# Patient Record
Sex: Female | Born: 1965
Health system: Southern US, Community
[De-identification: ages and names within clinical notes are randomized; demographics above are authoritative.]

## PROBLEM LIST (undated history)

## (undated) DIAGNOSIS — E079 Disorder of thyroid, unspecified: Secondary | ICD-10-CM

## (undated) DIAGNOSIS — I10 Essential (primary) hypertension: Secondary | ICD-10-CM

## (undated) DIAGNOSIS — R454 Irritability and anger: Secondary | ICD-10-CM

## (undated) DIAGNOSIS — E785 Hyperlipidemia, unspecified: Secondary | ICD-10-CM

## (undated) HISTORY — PX: ROTATOR CUFF REPAIR: SHX139

---

## 2000-05-14 ENCOUNTER — Encounter: Payer: Self-pay | Admitting: Internal Medicine

## 2000-05-14 ENCOUNTER — Ambulatory Visit (HOSPITAL_COMMUNITY): Admission: RE | Admit: 2000-05-14 | Discharge: 2000-05-14 | Payer: Self-pay | Admitting: Internal Medicine

## 2002-07-25 ENCOUNTER — Ambulatory Visit (HOSPITAL_COMMUNITY): Admission: RE | Admit: 2002-07-25 | Discharge: 2002-07-25 | Payer: Self-pay | Admitting: Obstetrics & Gynecology

## 2002-11-04 ENCOUNTER — Encounter: Payer: Self-pay | Admitting: Internal Medicine

## 2002-11-04 ENCOUNTER — Ambulatory Visit (HOSPITAL_COMMUNITY): Admission: RE | Admit: 2002-11-04 | Discharge: 2002-11-04 | Payer: Self-pay | Admitting: Internal Medicine

## 2004-06-06 ENCOUNTER — Ambulatory Visit (HOSPITAL_COMMUNITY): Admission: RE | Admit: 2004-06-06 | Discharge: 2004-06-06 | Payer: Self-pay | Admitting: Internal Medicine

## 2004-06-09 ENCOUNTER — Ambulatory Visit (HOSPITAL_COMMUNITY): Admission: RE | Admit: 2004-06-09 | Discharge: 2004-06-09 | Payer: Self-pay | Admitting: Internal Medicine

## 2007-09-08 ENCOUNTER — Emergency Department (HOSPITAL_COMMUNITY): Admission: EM | Admit: 2007-09-08 | Discharge: 2007-09-08 | Payer: Self-pay | Admitting: Emergency Medicine

## 2010-06-17 NOTE — Op Note (Signed)
   NAME:  Deborah Baker, Deborah Baker                         ACCOUNT NO.:  [id number]   MEDICAL RECORD NO.:  [id number]                   PATIENT TYPE:  AMB   LOCATION:  DAY                                  FACILITY:  [facility]   PHYSICIAN:  Lazaro Arms, M.D.                DATE OF BIRTH:  1965-10-12   DATE OF PROCEDURE:  07/25/2002  DATE OF DISCHARGE:                                 OPERATIVE REPORT   PREOPERATIVE DIAGNOSIS:  Symptomatic and persistent right Bartholin's gland  cyst.   POSTOPERATIVE DIAGNOSIS:  Symptomatic and persistent right Bartholin's gland  cyst.   OPERATION/PROCEDURE:  Excision of right Bartholin's gland cyst.   SURGEON:  Lazaro Arms, M.D.   ANESTHESIA:  General endotracheal anesthesia.   FINDINGS:  The patient has had Bartholin's gland cyst for sometime.  At  times it has been abscessed and then drained.  It is currently draining.   DESCRIPTION OF PROCEDURE:  The patient was taken to the operating room and  placed in the supine position where she underwent general endotracheal  anesthesia.  She was then placed in the dorsal lithotomy position and her  lower abdomen, perineum vagina and perianal area were prepped and draped in  the usual sterile fashion.  The Bartholin's gland cyst was identified.  An  incision was made on the right vulva just lateral to the hymenal ring on the  right and dissected and identified the Bartholin's gland cyst.  It was  rather oblong and extended back and the Bartholin's gland canal was very  indurated.  It was probably about the 2.5 cm in size.  The surrounding  vulvar fat and tissue, however, was also indurated and were removed as well.  The cavity was made hemostatic with electrocautery and it was closed in  layers with 3-0 Monocryl.  It was probably closed in at least three layers.  The superficial skin was then closed with interrupted sutures as well.  There was good hemostasis.  0.5% Marcaine with 1:200,000 epinephrine was  injected as a local anesthetic.  After the procedure the patient was  awakened from anesthesia and taken to the recovery room in good stable  condition.  All counts were correct x 3.  She experienced 250 mL of blood  loss.                                               Lazaro Arms, M.D.    Loraine Maple  D:  07/25/2002  T:  07/25/2002  Job:  [id number]

## 2013-02-03 ENCOUNTER — Emergency Department (HOSPITAL_COMMUNITY)
Admission: EM | Admit: 2013-02-03 | Discharge: 2013-02-03 | Disposition: A | Discharge status: Home / Self Care | Payer: Self-pay | Attending: Emergency Medicine | Admitting: Emergency Medicine

## 2013-02-03 ENCOUNTER — Encounter (HOSPITAL_COMMUNITY): Payer: Self-pay | Admitting: Emergency Medicine

## 2013-02-03 DIAGNOSIS — Z79899 Other long term (current) drug therapy: Secondary | ICD-10-CM | POA: Insufficient documentation

## 2013-02-03 DIAGNOSIS — M7989 Other specified soft tissue disorders: Secondary | ICD-10-CM | POA: Insufficient documentation

## 2013-02-03 DIAGNOSIS — I10 Essential (primary) hypertension: Secondary | ICD-10-CM | POA: Insufficient documentation

## 2013-02-03 HISTORY — DX: Essential (primary) hypertension: I10

## 2013-02-03 LAB — COMPREHENSIVE METABOLIC PANEL
ALT: 16 U/L (ref 0–35)
AST: 16 U/L (ref 0–37)
Albumin: 3.9 g/dL (ref 3.5–5.2)
Alkaline Phosphatase: 87 U/L (ref 39–117)
BUN: 10 mg/dL (ref 6–23)
CO2: 28 mEq/L (ref 19–32)
CREATININE: 0.72 mg/dL (ref 0.50–1.10)
Calcium: 9.4 mg/dL (ref 8.4–10.5)
Chloride: 101 mEq/L (ref 96–112)
GFR calc Af Amer: >90 mL/min (ref >90)
GFR calc non Af Amer: >90 mL/min (ref >90)
Glucose, Bld: 101 mg/dL — ABNORMAL HIGH (ref 70–99)
Potassium: 3.8 mEq/L (ref 3.7–5.3)
SODIUM: 141 meq/L (ref 137–147)
TOTAL PROTEIN: 7.1 g/dL (ref 6.0–8.3)
Total Bilirubin: 0.2 mg/dL — ABNORMAL LOW (ref 0.3–1.2)

## 2013-02-03 LAB — CBC WITH DIFFERENTIAL/PLATELET
Basophils Absolute: 0.1 10*3/uL (ref 0.0–0.1)
Basophils Relative: 1 % (ref 0–1)
EOS ABS: 0.2 10*3/uL (ref 0.0–0.7)
EOS PCT: 4 % (ref 0–5)
HEMATOCRIT: 34.3 % — AB (ref 36.0–46.0)
Hemoglobin: 11.5 g/dL — ABNORMAL LOW (ref 12.0–15.0)
Lymphocytes Relative: 32 % (ref 12–46)
Lymphs Abs: 1.8 10*3/uL (ref 0.7–4.0)
MCH: 29.2 pg (ref 26.0–34.0)
MCHC: 33.5 g/dL (ref 30.0–36.0)
MCV: 87.1 fL (ref 78.0–100.0)
MONO ABS: 0.6 10*3/uL (ref 0.1–1.0)
Monocytes Relative: 10 % (ref 3–12)
Neutro Abs: 3 10*3/uL (ref 1.7–7.7)
Neutrophils Relative %: 53 % (ref 43–77)
Platelets: 300 10*3/uL (ref 150–400)
RBC: 3.94 MIL/uL (ref 3.87–5.11)
RDW: 12.6 % (ref 11.5–15.5)
WBC: 5.7 10*3/uL (ref 4.0–10.5)

## 2013-02-03 MED ORDER — HYDROCHLOROTHIAZIDE 25 MG PO TABS: 25.0000 mg | ORAL_TABLET | Freq: Every day | ORAL | Status: DC

## 2013-02-03 NOTE — ED Notes (Signed)
Pt alert & oriented x4, stable gait. Patient  given discharge instructions, paperwork & prescription(s). Patient verbalized understanding. Pt left department w/ no further questions. 

## 2013-02-03 NOTE — Discharge Instructions (Signed)
Started her blood pressure medicine hydrochlorothiazide. Make plans and followup the health Department sometime in the next week have blood pressure rechecked on the medicine. Your basic chemistries here today were normal. Return for any new or worse symptoms. Return for a stroke symptoms severe chest pain shortness of breath severe headache.

## 2013-02-03 NOTE — ED Provider Notes (Signed)
CSN: [id number]     Arrival date & time 02/03/13  1135 History   First MD Initiated Contact with Patient 02/03/13 1746 This chart was scribed for Deborah JakesScott W. Donnelle Olmeda, MD by Valera CastleSteven Perry, ED Scribe. This patient was seen in room [facility] and the patient's care was started at 6:06 PM.      Chief Complaint  Patient presents with  . Hypertension    Patient is a 48 y.o. female presenting with hypertension. The history is provided by the patient. No language interpreter was used.  Hypertension This is a new problem. Episode onset: Earlier this morning. The problem occurs constantly. The problem has been gradually improving. Pertinent negatives include no chest pain, no abdominal pain, no headaches and no shortness of breath. She has tried nothing for the symptoms.   HPI Comments: Deborah Baker is a 48 y.o. female who presents to the Emergency Department complaining of HTN, onset earlier today when she was about to go under tooth extraction at dentist. She reports going to PCP about 2:30 PM today, who she states then sent here to the ED. Pt denies being on any medication for BP. She reports her BP of 230/140 is normal. She reports ever since coming off of HTCZ her BP has been high. She reports she was on 25 mg once a day, with the option of twice a day as needed. She reports mild LE swelling. She denies headache, chest pain, and any other associated symptoms.   PCP - Default, Provider, MD  Past Medical History  Diagnosis Date  . Hypertension    Past Surgical History  Procedure Laterality Date  . Rotator cuff repair     History reviewed. No pertinent family history. History  Substance Use Topics  . Smoking status: Never Smoker   . Smokeless tobacco: Not on file  . Alcohol Use: Yes   OB History   Grav Para Term Preterm Abortions TAB SAB Ect Mult Living                 Review of Systems  Constitutional: Negative for fever and chills.  HENT: Negative for rhinorrhea and sore throat.    Eyes: Negative for visual disturbance.  Respiratory: Negative for cough and shortness of breath.   Cardiovascular: Positive for leg swelling (bilateral). Negative for chest pain.       + HTN.  Gastrointestinal: Negative for nausea, vomiting, abdominal pain and diarrhea.  Genitourinary: Negative for dysuria.  Musculoskeletal: Negative for back pain and neck pain.  Skin: Negative for rash.  Neurological: Negative for weakness, numbness and headaches.  Hematological: Does not bruise/bleed easily.  Psychiatric/Behavioral: Negative for confusion.    Allergies  Sulfa antibiotics  Home Medications   Current Outpatient Rx  Name  Route  Sig  Dispense  Refill  . ibuprofen (ADVIL,MOTRIN) 200 MG tablet   Oral   Take 200 mg by mouth every 6 (six) hours as needed.         . hydrochlorothiazide (HYDRODIURIL) 25 MG tablet   Oral   Take 1 tablet (25 mg total) by mouth daily.   14 tablet   4     BP 214/78  Pulse 69  Temp(Src) 97.7 F (36.5 C) (Oral)  Resp 20  Ht 5\' 3"  (1.6 m)  Wt 212 lb (96.163 kg)  BMI 37.56 kg/m2  SpO2 99%  LMP 12/29/2012  Physical Exam  Nursing note and vitals reviewed. Constitutional: She is oriented to person, place, and time. She appears well-developed and  well-nourished. No distress.  HENT:  Head: Normocephalic and atraumatic.  Mouth/Throat: Oropharynx is clear and moist and mucous membranes are normal.  Eyes: EOM are normal.  Neck: Neck supple. No tracheal deviation present.  Cardiovascular: Normal rate, regular rhythm and normal heart sounds.  Exam reveals no gallop and no friction rub.   No murmur heard. Pulmonary/Chest: Effort normal and breath sounds normal. No respiratory distress. She has no wheezes. She has no rales.  Abdominal: Soft. There is no tenderness.  Musculoskeletal: Normal range of motion. She exhibits no edema.  Neurological: She is alert and oriented to person, place, and time. No cranial nerve deficit. She exhibits normal muscle  tone. Coordination normal.  Skin: Skin is warm and dry.  Psychiatric: She has a normal mood and affect. Her behavior is normal.    ED Course  Procedures (including critical care time)  DIAGNOSTIC STUDIES: Oxygen Saturation is 99% on room air, normal by my interpretation.    COORDINATION OF CARE: 6:11 PM-Discussed treatment plan with pt at bedside and pt agreed to plan.   Results for orders placed during the hospital encounter of 02/03/13  CBC WITH DIFFERENTIAL      Result Value Range   WBC 5.7  4.0 - 10.5 K/uL   RBC 3.94  3.87 - 5.11 MIL/uL   Hemoglobin 11.5 (*) 12.0 - 15.0 g/dL   HCT 40.9 (*) 81.1 - 91.4 %   MCV 87.1  78.0 - 100.0 fL   MCH 29.2  26.0 - 34.0 pg   MCHC 33.5  30.0 - 36.0 g/dL   RDW 78.2  95.6 - 21.3 %   Platelets 300  150 - 400 K/uL   Neutrophils Relative % 53  43 - 77 %   Neutro Abs 3.0  1.7 - 7.7 K/uL   Lymphocytes Relative 32  12 - 46 %   Lymphs Abs 1.8  0.7 - 4.0 K/uL   Monocytes Relative 10  3 - 12 %   Monocytes Absolute 0.6  0.1 - 1.0 K/uL   Eosinophils Relative 4  0 - 5 %   Eosinophils Absolute 0.2  0.0 - 0.7 K/uL   Basophils Relative 1  0 - 1 %   Basophils Absolute 0.1  0.0 - 0.1 K/uL  COMPREHENSIVE METABOLIC PANEL      Result Value Range   Sodium 141  137 - 147 mEq/L   Potassium 3.8  3.7 - 5.3 mEq/L   Chloride 101  96 - 112 mEq/L   CO2 28  19 - 32 mEq/L   Glucose, Bld 101 (*) 70 - 99 mg/dL   BUN 10  6 - 23 mg/dL   Creatinine, Ser 0.86  0.50 - 1.10 mg/dL   Calcium 9.4  8.4 - 57.8 mg/dL   Total Protein 7.1  6.0 - 8.3 g/dL   Albumin 3.9  3.5 - 5.2 g/dL   AST 16  0 - 37 U/L   ALT 16  0 - 35 U/L   Alkaline Phosphatase 87  39 - 117 U/L   Total Bilirubin 0.2 (*) 0.3 - 1.2 mg/dL   GFR calc non Af Amer >90  >90 mL/min   GFR calc Af Amer >90  >90 mL/min   No results found.   EKG Interpretation   None      Medications - No data to display   MDM   1. Hypertension    Patient with history of hypertension the past has been on  hydrochlorothiazide as well for  for a long period of time. Patient's blood pressure was markedly elevated at the oral surgeons today. Patient referred to health department and referred her here patient not having any end organ symptoms suggestive of a hypertensive emergency no headache or strokelike symptoms no chest pain no shortness of breath. Patient's blood pressure here without any treatment has improved significantly most recently was 182/77 still elevated on the systolic number we'll start her back on her hydrochlorothiazide and have her followup with the health department for monitoring of blood pressure.    I personally performed the services described in this documentation, which was scribed in my presence. The recorded information has been reviewed and is accurate.     Deborah Jakes, MD 02/03/13 Rickey Primus

## 2013-02-03 NOTE — ED Notes (Signed)
Sent from health dept for hypertension.  bp 230/140 .  Had been to dentist for  Tooth extraction and then to health dept due to high bp.

## 2013-04-01 ENCOUNTER — Other Ambulatory Visit (HOSPITAL_COMMUNITY): Payer: Self-pay | Admitting: Family Medicine

## 2013-04-01 ENCOUNTER — Ambulatory Visit (HOSPITAL_COMMUNITY)
Admission: RE | Admit: 2013-04-01 | Discharge: 2013-04-01 | Disposition: A | Discharge status: Home / Self Care | Payer: Self-pay | Source: Ambulatory Visit | Attending: Family Medicine | Admitting: Family Medicine

## 2013-04-01 DIAGNOSIS — M25519 Pain in unspecified shoulder: Secondary | ICD-10-CM | POA: Insufficient documentation

## 2013-04-01 DIAGNOSIS — M25511 Pain in right shoulder: Secondary | ICD-10-CM

## 2014-02-08 ENCOUNTER — Emergency Department (HOSPITAL_COMMUNITY)
Admission: EM | Admit: 2014-02-08 | Discharge: 2014-02-08 | Disposition: A | Discharge status: Home / Self Care | Payer: 59 | Attending: Emergency Medicine | Admitting: Emergency Medicine

## 2014-02-08 ENCOUNTER — Emergency Department (HOSPITAL_COMMUNITY): Payer: 59

## 2014-02-08 ENCOUNTER — Encounter (HOSPITAL_COMMUNITY): Payer: Self-pay | Admitting: Adult Health

## 2014-02-08 DIAGNOSIS — R52 Pain, unspecified: Secondary | ICD-10-CM

## 2014-02-08 DIAGNOSIS — R109 Unspecified abdominal pain: Secondary | ICD-10-CM

## 2014-02-08 DIAGNOSIS — Z3202 Encounter for pregnancy test, result negative: Secondary | ICD-10-CM | POA: Diagnosis not present

## 2014-02-08 DIAGNOSIS — Z79899 Other long term (current) drug therapy: Secondary | ICD-10-CM | POA: Insufficient documentation

## 2014-02-08 DIAGNOSIS — B349 Viral infection, unspecified: Secondary | ICD-10-CM | POA: Diagnosis not present

## 2014-02-08 DIAGNOSIS — F419 Anxiety disorder, unspecified: Secondary | ICD-10-CM | POA: Diagnosis not present

## 2014-02-08 DIAGNOSIS — J209 Acute bronchitis, unspecified: Secondary | ICD-10-CM

## 2014-02-08 DIAGNOSIS — M549 Dorsalgia, unspecified: Secondary | ICD-10-CM | POA: Insufficient documentation

## 2014-02-08 DIAGNOSIS — K219 Gastro-esophageal reflux disease without esophagitis: Secondary | ICD-10-CM | POA: Insufficient documentation

## 2014-02-08 DIAGNOSIS — F329 Major depressive disorder, single episode, unspecified: Secondary | ICD-10-CM | POA: Insufficient documentation

## 2014-02-08 DIAGNOSIS — I10 Essential (primary) hypertension: Secondary | ICD-10-CM | POA: Diagnosis not present

## 2014-02-08 LAB — URINALYSIS, ROUTINE W REFLEX MICROSCOPIC
Bilirubin Urine: NEGATIVE
Glucose, UA: NEGATIVE mg/dL
Ketones, ur: NEGATIVE mg/dL
LEUKOCYTES UA: NEGATIVE
Nitrite: NEGATIVE
Protein, ur: NEGATIVE mg/dL
Specific Gravity, Urine: 1.023 (ref 1.005–1.030)
UROBILINOGEN UA: 0.2 mg/dL (ref 0.0–1.0)
pH: 6 (ref 5.0–8.0)

## 2014-02-08 LAB — COMPREHENSIVE METABOLIC PANEL
ALT: 25 U/L (ref 0–35)
AST: 26 U/L (ref 0–37)
Albumin: 4 g/dL (ref 3.5–5.2)
Alkaline Phosphatase: 99 U/L (ref 39–117)
Anion gap: 14 (ref 5–15)
BILIRUBIN TOTAL: 0.4 mg/dL (ref 0.3–1.2)
BUN: 14 mg/dL (ref 6–23)
CALCIUM: 9.6 mg/dL (ref 8.4–10.5)
CO2: 25 mmol/L (ref 19–32)
CREATININE: 0.86 mg/dL (ref 0.50–1.10)
Chloride: 99 mEq/L (ref 96–112)
GFR calc Af Amer: >90 mL/min (ref >90)
GFR calc non Af Amer: 79 mL/min — ABNORMAL LOW (ref >90)
GLUCOSE: 111 mg/dL — AB (ref 70–99)
POTASSIUM: 3.4 mmol/L — AB (ref 3.5–5.1)
SODIUM: 138 mmol/L (ref 135–145)
Total Protein: 7.4 g/dL (ref 6.0–8.3)

## 2014-02-08 LAB — CBC WITH DIFFERENTIAL/PLATELET
BASOS PCT: 0 % (ref 0–1)
Basophils Absolute: 0 10*3/uL (ref 0.0–0.1)
Eosinophils Absolute: 0 10*3/uL (ref 0.0–0.7)
Eosinophils Relative: 0 % (ref 0–5)
HCT: 35 % — ABNORMAL LOW (ref 36.0–46.0)
Hemoglobin: 11.5 g/dL — ABNORMAL LOW (ref 12.0–15.0)
LYMPHS PCT: 12 % (ref 12–46)
Lymphs Abs: 2 10*3/uL (ref 0.7–4.0)
MCH: 28 pg (ref 26.0–34.0)
MCHC: 32.9 g/dL (ref 30.0–36.0)
MCV: 85.2 fL (ref 78.0–100.0)
Monocytes Absolute: 0.8 10*3/uL (ref 0.1–1.0)
Monocytes Relative: 5 % (ref 3–12)
NEUTROS PCT: 83 % — AB (ref 43–77)
Neutro Abs: 14 10*3/uL — ABNORMAL HIGH (ref 1.7–7.7)
PLATELETS: 366 10*3/uL (ref 150–400)
RBC: 4.11 MIL/uL (ref 3.87–5.11)
RDW: 13.8 % (ref 11.5–15.5)
WBC: 16.8 10*3/uL — AB (ref 4.0–10.5)

## 2014-02-08 LAB — URINE MICROSCOPIC-ADD ON

## 2014-02-08 LAB — LIPASE, BLOOD: Lipase: 27 U/L (ref 11–59)

## 2014-02-08 LAB — POC URINE PREG, ED: Preg Test, Ur: NEGATIVE

## 2014-02-08 MED ORDER — ALBUTEROL SULFATE HFA 108 (90 BASE) MCG/ACT IN AERS: 1.0000 | INHALATION_SPRAY | Freq: Four times a day (QID) | RESPIRATORY_TRACT | Status: DC | PRN

## 2014-02-08 MED ORDER — ONDANSETRON HCL 4 MG PO TABS: 4.0000 mg | ORAL_TABLET | Freq: Four times a day (QID) | ORAL | Status: DC

## 2014-02-08 MED ORDER — HYDROCODONE-ACETAMINOPHEN 5-325 MG PO TABS: 1.0000 | ORAL_TABLET | Freq: Four times a day (QID) | ORAL | Status: DC | PRN

## 2014-02-08 MED ORDER — HYDROMORPHONE HCL 1 MG/ML IJ SOLN
1.0000 mg | Freq: Once | INTRAMUSCULAR | Status: AC
  Administered 2014-02-08: 1 mg via INTRAVENOUS
  Filled 2014-02-08: qty 1

## 2014-02-08 MED ORDER — ONDANSETRON 4 MG PO TBDP
8.0000 mg | ORAL_TABLET | Freq: Once | ORAL | Status: DC
  Filled 2014-02-08: qty 2

## 2014-02-08 MED ORDER — ONDANSETRON HCL 4 MG/2ML IJ SOLN
4.0000 mg | Freq: Once | INTRAMUSCULAR | Status: AC
  Administered 2014-02-08: 4 mg via INTRAVENOUS
  Filled 2014-02-08: qty 2

## 2014-02-08 MED ORDER — SODIUM CHLORIDE 0.9 % IV BOLUS (SEPSIS)
1000.0000 mL | Freq: Once | INTRAVENOUS | Status: AC
  Administered 2014-02-08: 1000 mL via INTRAVENOUS

## 2014-02-08 MED ORDER — BENZONATATE 100 MG PO CAPS: 100.0000 mg | ORAL_CAPSULE | Freq: Three times a day (TID) | ORAL | Status: DC

## 2014-02-08 NOTE — ED Notes (Signed)
PT states, "I aspirated this morning, i was laying flat and the food came back up into my mouth and I swallowed it then I started coughing and then I started violently vomiting and I have been nauseated and had painin my right upper side ever since, I broke a rib , I think" denies diarrhea, denies pain in abdomen. Reports pain in right side upper flank.

## 2014-02-08 NOTE — ED Provider Notes (Signed)
CSN: [id number]     Arrival date & time 02/08/14  1714 History   First MD Initiated Contact with Patient 02/08/14 1939     Chief Complaint  Patient presents with  . Abdominal Pain   HPI  Patient is a 49 year old female with past medical history of hypertension, depression, anxiety, and acid reflux who presents to the emergency room for evaluation of right flank pain, nausea, and vomiting which started this morning. Patient states that she was sleeping when she aspirated in her sleep which caused her to cough violently and immediately started vomiting. She is continue to have nausea and vomiting with 12 episodes total. She has been vomiting food product. She has not had any bilious emesis. Patient states that once her vomiting stopped at approximately 2 PM she developed right sided flank pain which is severe and nonradiating. Patient states that she does not have a gallbladder. Patient states that her pain is worse with movement. She feels that she may have broke a rib. Patient has never had pain like this before. She does state that she was violently vomiting. She denies any urinary frequency or urgency. She denies hematuria or dysuria. She has no vaginal discharge or bleeding. Patient has no other abdominal pain. She denies fevers, chills, diarrhea, melena, hematochezia.  Past Medical History  Diagnosis Date  . Hypertension    Past Surgical History  Procedure Laterality Date  . Rotator cuff repair     History reviewed. No pertinent family history. History  Substance Use Topics  . Smoking status: Never Smoker   . Smokeless tobacco: Not on file  . Alcohol Use: Yes   OB History    No data available     Review of Systems  Constitutional: Negative for fever, chills and fatigue.  Respiratory: Positive for cough and choking. Negative for chest tightness, shortness of breath and wheezing.   Cardiovascular: Negative for chest pain and palpitations.  Gastrointestinal: Positive for nausea and  vomiting. Negative for abdominal pain, diarrhea and constipation.  Genitourinary: Positive for flank pain. Negative for dysuria, urgency, frequency and difficulty urinating.  Musculoskeletal: Positive for back pain.  All other systems reviewed and are negative.     Allergies  Sulfa antibiotics  Home Medications   Prior to Admission medications   Medication Sig Start Date End Date Taking? Authorizing Provider  alum hydroxide-mag trisilicate (GAVISCON) 80-20 MG CHEW chewable tablet Chew 1 tablet by mouth 3 (three) times daily as needed for indigestion or heartburn.   Yes Historical Provider, MD  Azilsartan-Chlorthalidone (EDARBYCLOR) 40-25 MG TABS Take 1 tablet by mouth daily.   Yes Historical Provider, MD  escitalopram (LEXAPRO) 20 MG tablet Take 20 mg by mouth at bedtime.   Yes Historical Provider, MD  LORazepam (ATIVAN) 0.5 MG tablet Take 0.5 mg by mouth every 8 (eight) hours.   Yes Historical Provider, MD  lovastatin (MEVACOR) 20 MG tablet Take 20 mg by mouth 2 (two) times daily.   Yes Historical Provider, MD  montelukast (SINGULAIR) 10 MG tablet Take 10 mg by mouth at bedtime.   Yes Historical Provider, MD  omeprazole (PRILOSEC) 20 MG capsule Take 20 mg by mouth daily.   Yes Historical Provider, MD  albuterol (PROVENTIL HFA;VENTOLIN HFA) 108 (90 BASE) MCG/ACT inhaler Inhale 1-2 puffs into the lungs every 6 (six) hours as needed for wheezing or shortness of breath. 02/08/14   Ayoub Arey A Forcucci, PA-C  benzonatate (TESSALON) 100 MG capsule Take 1 capsule (100 mg total) by mouth every 8 (eight)  hours. 02/08/14   Qiana Landgrebe A Forcucci, PA-C  hydrochlorothiazide (HYDRODIURIL) 25 MG tablet Take 1 tablet (25 mg total) by mouth daily. 02/03/13   Vanetta Mulders, MD  HYDROcodone-acetaminophen (NORCO/VICODIN) 5-325 MG per tablet Take 1 tablet by mouth every 6 (six) hours as needed. 02/08/14   Cleotha Tsang A Forcucci, PA-C  ondansetron (ZOFRAN) 4 MG tablet Take 1 tablet (4 mg total) by mouth every 6 (six)  hours. 02/08/14   Antwaine Boomhower A Forcucci, PA-C   BP 115/54 mmHg  Pulse 76  Temp(Src) 98.9 F (37.2 C) (Oral)  Resp 14  Ht  (1.575 m)  Wt 210 lb (95.255 kg)  BMI 38.40 kg/m2  SpO2 96%  LMP 02/07/2014 (LMP Unknown) Physical Exam  Constitutional: She is oriented to person, place, and time. She appears well-developed and well-nourished. No distress.  HENT:  Head: Normocephalic and atraumatic.  Mouth/Throat: Oropharynx is clear and moist. No oropharyngeal exudate.  Eyes: Conjunctivae and EOM are normal. Pupils are equal, round, and reactive to light. No scleral icterus.  Neck: Normal range of motion. Neck supple. No JVD present. No thyromegaly present.  Cardiovascular: Normal rate, regular rhythm, normal heart sounds and intact distal pulses.  Exam reveals no gallop and no friction rub.   No murmur heard. Pulmonary/Chest: Effort normal and breath sounds normal. No respiratory distress. She has no wheezes. She has no rales. She exhibits no tenderness.  Abdominal: Soft. Normal appearance and bowel sounds are normal. She exhibits no distension and no mass. There is no tenderness. There is CVA tenderness (Right-sided). There is no rigidity, no rebound, no guarding, no tenderness at McBurney's point and negative Murphy's sign.  Musculoskeletal: Normal range of motion.  Lymphadenopathy:    She has no cervical adenopathy.  Neurological: She is alert and oriented to person, place, and time.  Skin: Skin is warm and dry. She is not diaphoretic.  Psychiatric: She has a normal mood and affect. Her behavior is normal. Judgment and thought content normal.  Nursing note and vitals reviewed.   ED Course  Procedures (including critical care time) Labs Review Labs Reviewed  COMPREHENSIVE METABOLIC PANEL - Abnormal; Notable for the following:    Potassium 3.4 (*)    Glucose, Bld 111 (*)    GFR calc non Af Amer 79 (*)    All other components within normal limits  CBC WITH DIFFERENTIAL - Abnormal;  Notable for the following:    WBC 16.8 (*)    Hemoglobin 11.5 (*)    HCT 35.0 (*)    Neutrophils Relative % 83 (*)    Neutro Abs 14.0 (*)    All other components within normal limits  URINALYSIS, ROUTINE W REFLEX MICROSCOPIC - Abnormal; Notable for the following:    Hgb urine dipstick LARGE (*)    All other components within normal limits  URINE MICROSCOPIC-ADD ON - Abnormal; Notable for the following:    Squamous Epithelial / LPF MANY (*)    Bacteria, UA MANY (*)    All other components within normal limits  LIPASE, BLOOD  POC URINE PREG, ED    Imaging Review Dg Chest 2 View  02/08/2014   CLINICAL DATA:  Pt woke up this morning choking and vomiting. Pt has been vomiting all day and it is causing right sided back pain that hurts constantly but especially during deep inspiration. Pt states that she feels like she broke a rib.  EXAM: CHEST  2 VIEW  COMPARISON:  09/08/2007  FINDINGS: Bilateral diffuse interstitial thickening and peribronchial  cuffing most concerning for bronchitis. There is no focal parenchymal opacity, pleural effusion, or pneumothorax. The heart and mediastinal contours are unremarkable.  The osseous structures are unremarkable.  IMPRESSION: Bilateral diffuse interstitial thickening and peribronchial cuffing most concerning for bronchitis.   Electronically Signed   By: Elige Ko   On: 02/08/2014 20:56     EKG Interpretation None      MDM   Final diagnoses:  Pain  Acute bronchitis, unspecified organism  Viral syndrome  Right flank pain   Patient is a 49 year old female who presents emergency room for evaluation of nausea, vomiting, and right flank pain. Physical exam reveals CVA tenderness over the right costovertebral angle. There are no other physical findings. Lungs are clear to auscultation. Patient is alert and nontoxic-appearing. CMP reveals mild hypokalemia but is otherwise unremarkable. CBC reveals leukocytosis. Lipase is negative. UA is negative for  frank infection and does appear to be contaminated. Urine pregnancy is negative. Chest x-ray reveals likely bronchitis. Suspect that this is likely viral syndrome. We'll discharge home with symptomatic treatment including hydrocodone, albuterol, Tessalon, and Zofran. Patient to return for intractable nausea and vomiting, intractable pain, or any other concerning symptoms. Patient states understanding and agreement at this time. I've discussed this patient with Dr. Blinda Leatherwood who agrees with the above workup and plan at this time. Patient stable for discharge.   Eben Burow, PA-C 02/08/14 2232  Gilda Crease, MD 02/08/14 2237

## 2014-02-08 NOTE — Discharge Instructions (Signed)
Muscle Strain A muscle strain is an injury that occurs when a muscle is stretched beyond its normal length. Usually a small number of muscle fibers are torn when this happens. Muscle strain is rated in degrees. First-degree strains have the least amount of muscle fiber tearing and pain. Second-degree and third-degree strains have increasingly more tearing and pain.  Usually, recovery from muscle strain takes 1-2 weeks. Complete healing takes 5-6 weeks.  CAUSES  Muscle strain happens when a sudden, violent force placed on a muscle stretches it too far. This may occur with lifting, sports, or a fall.  RISK FACTORS Muscle strain is especially common in athletes.  SIGNS AND SYMPTOMS At the site of the muscle strain, there may be:  Pain.  Bruising.  Swelling.  Difficulty using the muscle due to pain or lack of normal function. DIAGNOSIS  Your health care provider will perform a physical exam and ask about your medical history. TREATMENT  Often, the best treatment for a muscle strain is resting, icing, and applying cold compresses to the injured area.  HOME CARE INSTRUCTIONS   Use the PRICE method of treatment to promote muscle healing during the first 2-3 days after your injury. The PRICE method involves:  Protecting the muscle from being injured again.  Restricting your activity and resting the injured body part.  Icing your injury. To do this, put ice in a plastic bag. Place a towel between your skin and the bag. Then, apply the ice and leave it on from 15-20 minutes each hour. After the third day, switch to moist heat packs.  Apply compression to the injured area with a splint or elastic bandage. Be careful not to wrap it too tightly. This may interfere with blood circulation or increase swelling.  Elevate the injured body part above the level of your heart as often as you can.  Only take over-the-counter or prescription medicines for pain, discomfort, or fever as directed by your  health care provider.  Warming up prior to exercise helps to prevent future muscle strains. SEEK MEDICAL CARE IF:   You have increasing pain or swelling in the injured area.  You have numbness, tingling, or a significant loss of strength in the injured area. MAKE SURE YOU:   Understand these instructions.  Will watch your condition.  Will get help right away if you are not doing well or get worse. Document Released: 01/16/2005 Document Revised: 11/06/2012 Document Reviewed: 08/15/2012 [facility] Patient Information 2015 [place], [place]. This information is not intended to replace advice given to you by your health care provider. Make sure you discuss any questions you have with your health care provider.  Viral Infections A viral infection can be caused by different types of viruses.Most viral infections are not serious and resolve on their own. However, some infections may cause severe symptoms and may lead to further complications. SYMPTOMS Viruses can frequently cause:  Minor sore throat.  Aches and pains.  Headaches.  Runny nose.  Different types of rashes.  Watery eyes.  Tiredness.  Cough.  Loss of appetite.  Gastrointestinal infections, resulting in nausea, vomiting, and diarrhea. These symptoms do not respond to antibiotics because the infection is not caused by bacteria. However, you might catch a bacterial infection following the viral infection. This is sometimes called a "superinfection." Symptoms of such a bacterial infection may include:  Worsening sore throat with pus and difficulty swallowing.  Swollen neck glands.  Chills and a high or persistent fever.  Severe headache.  Tenderness  over the sinuses.  Persistent overall ill feeling (malaise), muscle aches, and tiredness (fatigue).  Persistent cough.  Yellow, green, or brown mucus production with coughing. HOME CARE INSTRUCTIONS   Only take over-the-counter or prescription medicines for pain,  discomfort, diarrhea, or fever as directed by your caregiver.  Drink enough water and fluids to keep your urine clear or pale yellow. Sports drinks can provide valuable electrolytes, sugars, and hydration.  Get plenty of rest and maintain proper nutrition. Soups and broths with crackers or rice are fine. SEEK IMMEDIATE MEDICAL CARE IF:   You have severe headaches, shortness of breath, chest pain, neck pain, or an unusual rash.  You have uncontrolled vomiting, diarrhea, or you are unable to keep down fluids.  You or your child has an oral temperature above 102 F (38.9 C), not controlled by medicine.  Your baby is older than 3 months with a rectal temperature of 102 F (38.9 C) or higher.  Your baby is 48 months old or younger with a rectal temperature of 100.4 F (38 C) or higher. MAKE SURE YOU:   Understand these instructions.  Will watch your condition.  Will get help right away if you are not doing well or get worse. Document Released: 10/26/2004 Document Revised: 04/10/2011 Document Reviewed: 05/23/2010 [facility] Patient Information 2015 [place], [place]. This information is not intended to replace advice given to you by your health care provider. Make sure you discuss any questions you have with your health care provider.  Flank Pain Flank pain refers to pain that is located on the side of the body between the upper abdomen and the back. The pain may occur over a short period of time (acute) or may be long-term or reoccurring (chronic). It may be mild or severe. Flank pain can be caused by many things. CAUSES  Some of the more common causes of flank pain include:  Muscle strains.   Muscle spasms.   A disease of your spine (vertebral disk disease).   A lung infection (pneumonia).   Fluid around your lungs (pulmonary edema).   A kidney infection.   Kidney stones.   A very painful skin rash caused by the chickenpox virus (shingles).   Gallbladder disease.   HOME CARE INSTRUCTIONS  Home care will depend on the cause of your pain. In general,  Rest as directed by your caregiver.  Drink enough fluids to keep your urine clear or pale yellow.  Only take over-the-counter or prescription medicines as directed by your caregiver. Some medicines may help relieve the pain.  Tell your caregiver about any changes in your pain.  Follow up with your caregiver as directed. SEEK IMMEDIATE MEDICAL CARE IF:   Your pain is not controlled with medicine.   You have new or worsening symptoms.  Your pain increases.   You have abdominal pain.   You have shortness of breath.   You have persistent nausea or vomiting.   You have swelling in your abdomen.   You feel faint or pass out.   You have blood in your urine.  You have a fever or persistent symptoms for more than 2-3 days.  You have a fever and your symptoms suddenly get worse. MAKE SURE YOU:   Understand these instructions.  Will watch your condition.  Will get help right away if you are not doing well or get worse. Document Released: 03/09/2005 Document Revised: 10/11/2011 Document Reviewed: 08/31/2011 [facility] Patient Information 2015 [place], [place]. This information is not intended to replace advice  given to you by your health care provider. Make sure you discuss any questions you have with your health care provider. ° °

## 2014-05-22 ENCOUNTER — Emergency Department (HOSPITAL_COMMUNITY): Payer: Worker's Compensation

## 2014-05-22 ENCOUNTER — Encounter (HOSPITAL_COMMUNITY): Payer: Self-pay | Admitting: Emergency Medicine

## 2014-05-22 ENCOUNTER — Emergency Department (HOSPITAL_COMMUNITY)
Admission: EM | Admit: 2014-05-22 | Discharge: 2014-05-22 | Disposition: A | Discharge status: Home / Self Care | Payer: Worker's Compensation | Attending: Emergency Medicine | Admitting: Emergency Medicine

## 2014-05-22 DIAGNOSIS — S30811A Abrasion of abdominal wall, initial encounter: Secondary | ICD-10-CM | POA: Insufficient documentation

## 2014-05-22 DIAGNOSIS — Z23 Encounter for immunization: Secondary | ICD-10-CM | POA: Insufficient documentation

## 2014-05-22 DIAGNOSIS — S4991XA Unspecified injury of right shoulder and upper arm, initial encounter: Secondary | ICD-10-CM | POA: Diagnosis not present

## 2014-05-22 DIAGNOSIS — I10 Essential (primary) hypertension: Secondary | ICD-10-CM | POA: Insufficient documentation

## 2014-05-22 DIAGNOSIS — S0990XA Unspecified injury of head, initial encounter: Secondary | ICD-10-CM | POA: Insufficient documentation

## 2014-05-22 DIAGNOSIS — S51012A Laceration without foreign body of left elbow, initial encounter: Secondary | ICD-10-CM | POA: Diagnosis not present

## 2014-05-22 DIAGNOSIS — Y998 Other external cause status: Secondary | ICD-10-CM | POA: Insufficient documentation

## 2014-05-22 DIAGNOSIS — Z79899 Other long term (current) drug therapy: Secondary | ICD-10-CM | POA: Insufficient documentation

## 2014-05-22 DIAGNOSIS — Y92410 Unspecified street and highway as the place of occurrence of the external cause: Secondary | ICD-10-CM | POA: Insufficient documentation

## 2014-05-22 DIAGNOSIS — Y9389 Activity, other specified: Secondary | ICD-10-CM | POA: Insufficient documentation

## 2014-05-22 DIAGNOSIS — T148 Other injury of unspecified body region: Secondary | ICD-10-CM | POA: Diagnosis not present

## 2014-05-22 DIAGNOSIS — E785 Hyperlipidemia, unspecified: Secondary | ICD-10-CM | POA: Diagnosis not present

## 2014-05-22 DIAGNOSIS — T07XXXA Unspecified multiple injuries, initial encounter: Secondary | ICD-10-CM

## 2014-05-22 HISTORY — DX: Irritability and anger: R45.4

## 2014-05-22 HISTORY — DX: Hyperlipidemia, unspecified: E78.5

## 2014-05-22 LAB — URINALYSIS, ROUTINE W REFLEX MICROSCOPIC
BILIRUBIN URINE: NEGATIVE
Glucose, UA: NEGATIVE mg/dL
Hgb urine dipstick: NEGATIVE
KETONES UR: NEGATIVE mg/dL
Leukocytes, UA: NEGATIVE
NITRITE: NEGATIVE
PROTEIN: NEGATIVE mg/dL
Specific Gravity, Urine: 1.015 (ref 1.005–1.030)
UROBILINOGEN UA: 0.2 mg/dL (ref 0.0–1.0)
pH: 6.5 (ref 5.0–8.0)

## 2014-05-22 LAB — I-STAT CHEM 8, ED
BUN: 13 mg/dL (ref 6–23)
Calcium, Ion: 1.16 mmol/L (ref 1.12–1.23)
Chloride: 99 mmol/L (ref 96–112)
Creatinine, Ser: 0.8 mg/dL (ref 0.50–1.10)
GLUCOSE: 98 mg/dL (ref 70–99)
HCT: 33 % — ABNORMAL LOW (ref 36.0–46.0)
Hemoglobin: 11.2 g/dL — ABNORMAL LOW (ref 12.0–15.0)
POTASSIUM: 3.9 mmol/L (ref 3.5–5.1)
Sodium: 137 mmol/L (ref 135–145)
TCO2: 24 mmol/L (ref 0–100)

## 2014-05-22 MED ORDER — IOHEXOL 300 MG/ML  SOLN
100.0000 mL | Freq: Once | INTRAMUSCULAR | Status: AC | PRN
  Administered 2014-05-22: 100 mL via INTRAVENOUS

## 2014-05-22 MED ORDER — POVIDONE-IODINE 10 % EX SOLN
CUTANEOUS | Status: AC
  Administered 2014-05-22: 19:00:00
  Filled 2014-05-22: qty 118

## 2014-05-22 MED ORDER — LEVOFLOXACIN 500 MG PO TABS: 500.0000 mg | ORAL_TABLET | Freq: Every day | ORAL | Status: DC

## 2014-05-22 MED ORDER — TETANUS-DIPHTH-ACELL PERTUSSIS 5-2.5-18.5 LF-MCG/0.5 IM SUSP
0.5000 mL | Freq: Once | INTRAMUSCULAR | Status: AC
  Administered 2014-05-22: 0.5 mL via INTRAMUSCULAR
  Filled 2014-05-22: qty 0.5

## 2014-05-22 MED ORDER — LIDOCAINE-EPINEPHRINE (PF) 1 %-1:200000 IJ SOLN
20.0000 mL | Freq: Once | INTRAMUSCULAR | Status: AC
  Administered 2014-05-22: 20 mL
  Filled 2014-05-22: qty 10

## 2014-05-22 MED ORDER — HYDROCODONE-ACETAMINOPHEN 5-325 MG PO TABS: 1.0000 | ORAL_TABLET | ORAL | Status: DC | PRN
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
## 2014-05-22 NOTE — ED Notes (Signed)
Pt tolerated fluid challenge well and then ambulated in hall one time around nurses' station without difficulty.

## 2014-06-09 ENCOUNTER — Other Ambulatory Visit (HOSPITAL_COMMUNITY): Payer: Self-pay | Admitting: Internal Medicine

## 2014-06-09 DIAGNOSIS — M25511 Pain in right shoulder: Secondary | ICD-10-CM

## 2014-06-11 ENCOUNTER — Ambulatory Visit (HOSPITAL_COMMUNITY): Payer: 59

## 2014-06-17 ENCOUNTER — Ambulatory Visit (HOSPITAL_COMMUNITY)
Admission: RE | Admit: 2014-06-17 | Discharge: 2014-06-17 | Disposition: A | Discharge status: Home / Self Care | Payer: Worker's Compensation | Source: Ambulatory Visit | Attending: Internal Medicine | Admitting: Internal Medicine

## 2014-06-17 DIAGNOSIS — M25511 Pain in right shoulder: Secondary | ICD-10-CM | POA: Diagnosis not present

## 2014-06-22 ENCOUNTER — Ambulatory Visit (HOSPITAL_COMMUNITY): Payer: 59 | Attending: Occupational Therapy | Admitting: Occupational Therapy

## 2014-06-24 ENCOUNTER — Telehealth (HOSPITAL_COMMUNITY): Payer: Self-pay | Admitting: Occupational Therapy

## 2014-06-24 NOTE — Telephone Encounter (Signed)
Patient called to say she will have surgery and will not come for OT at this time.

## 2014-08-06 DIAGNOSIS — G8918 Other acute postprocedural pain: Secondary | ICD-10-CM | POA: Diagnosis not present

## 2014-08-06 DIAGNOSIS — Z79899 Other long term (current) drug therapy: Secondary | ICD-10-CM | POA: Diagnosis not present

## 2014-08-06 DIAGNOSIS — Z8659 Personal history of other mental and behavioral disorders: Secondary | ICD-10-CM | POA: Insufficient documentation

## 2014-08-06 DIAGNOSIS — Z9889 Other specified postprocedural states: Secondary | ICD-10-CM | POA: Diagnosis not present

## 2014-08-06 DIAGNOSIS — R110 Nausea: Secondary | ICD-10-CM | POA: Diagnosis present

## 2014-08-06 DIAGNOSIS — M25511 Pain in right shoulder: Secondary | ICD-10-CM | POA: Diagnosis not present

## 2014-08-06 DIAGNOSIS — I10 Essential (primary) hypertension: Secondary | ICD-10-CM | POA: Insufficient documentation

## 2014-08-06 DIAGNOSIS — E785 Hyperlipidemia, unspecified: Secondary | ICD-10-CM | POA: Diagnosis not present

## 2014-08-07 ENCOUNTER — Encounter (HOSPITAL_COMMUNITY): Payer: Self-pay

## 2014-08-07 ENCOUNTER — Emergency Department (HOSPITAL_COMMUNITY)
Admission: EM | Admit: 2014-08-07 | Discharge: 2014-08-07 | Disposition: A | Discharge status: Home / Self Care | Payer: Worker's Compensation | Attending: Emergency Medicine | Admitting: Emergency Medicine

## 2014-08-07 DIAGNOSIS — G8918 Other acute postprocedural pain: Secondary | ICD-10-CM | POA: Diagnosis not present

## 2014-08-07 MED ORDER — OXYCODONE-ACETAMINOPHEN 5-325 MG PO TABS
1.0000 | ORAL_TABLET | Freq: Once | ORAL | Status: AC
Start: 2014-08-07 — End: 2014-08-07
  Administered 2014-08-07: 1 via ORAL
  Filled 2014-08-07: qty 1

## 2014-08-07 MED ORDER — HYDROMORPHONE HCL 2 MG/ML IJ SOLN
2.0000 mg | Freq: Once | INTRAMUSCULAR | Status: AC
  Administered 2014-08-07: 2 mg via INTRAMUSCULAR
  Filled 2014-08-07: qty 1

## 2014-08-07 NOTE — Discharge Instructions (Signed)
PLEASE RETURN IF YOU HAVE ANY FEVER ABOVE 100.57F, VOMITING, IF PAIN CHANGES OR WORSENS IN YOUR SHOULDER OR ANY NEW CHEST PAIN OR NEW SHORTNESS OF BREATH TONIGHT

## 2014-08-07 NOTE — ED Provider Notes (Signed)
CSN: [id number]   Arrival date & time 08/06/14 2358  History  This chart was scribed for  Zadie Rhine, MD by Bethel Born, ED Scribe. This patient was seen in room APA04/APA04 and the patient's care was started at 12:51 AM.  Chief Complaint  Patient presents with  . Post-op Problem    The history is provided by the patient and a relative. No language interpreter was used.   Deborah Baker is a 49 y.o. female who presents to the Emergency Department complaining of constant severe pain in the right shoulder after arthroscopic surgery today (performed by Dr. Dion Saucier) . The pain radiates to the neck and fingers. Pt rates the pain 10/10 in severity. She states that the nerve block has worn off and Percocet is providing insufficient relief. Associated symptoms include nausea. No fever, vomiting, chest pain, abdominal pain  Past Medical History  Diagnosis Date  . Hypertension   . Outbursts of anger   . Hyperlipemia     Past Surgical History  Procedure Laterality Date  . Rotator cuff repair      No family history on file.  History  Substance Use Topics  . Smoking status: Never Smoker   . Smokeless tobacco: Not on file  . Alcohol Use: Yes     Review of Systems  Constitutional: Negative for fever.  Cardiovascular: Negative for chest pain.  Gastrointestinal: Positive for nausea. Negative for vomiting and abdominal pain.  Musculoskeletal:       Right shoulder pain  All other systems reviewed and are negative.   Home Medications   Prior to Admission medications   Medication Sig Start Date End Date Taking? Authorizing Provider  Azilsartan-Chlorthalidone (EDARBYCLOR) 40-25 MG TABS Take 1 tablet by mouth daily.   Yes Historical Provider, MD  B Complex Vitamins (B COMPLEX-B12 PO) Take 1 tablet by mouth daily.   Yes Historical Provider, MD  escitalopram (LEXAPRO) 20 MG tablet Take 20 mg by mouth daily.    Yes Historical Provider, MD  LORazepam (ATIVAN) 0.5 MG tablet Take 0.5 mg by  mouth every 8 (eight) hours.   Yes Historical Provider, MD  lovastatin (MEVACOR) 20 MG tablet Take 20 mg by mouth 2 (two) times daily.   Yes Historical Provider, MD  methocarbamol (ROBAXIN) 500 MG tablet Take 500 mg by mouth 4 (four) times daily.   Yes Historical Provider, MD  montelukast (SINGULAIR) 10 MG tablet Take 10 mg by mouth daily.    Yes Historical Provider, MD  omeprazole (PRILOSEC) 20 MG capsule Take 20 mg by mouth daily.   Yes Historical Provider, MD  ondansetron (ZOFRAN) 4 MG tablet Take 1 tablet (4 mg total) by mouth every 6 (six) hours. 02/08/14  Yes Courtney Forcucci, PA-C  oxyCODONE-acetaminophen (PERCOCET/ROXICET) 5-325 MG per tablet Take by mouth every 4 (four) hours as needed for severe pain.   Yes Historical Provider, MD  albuterol (PROVENTIL HFA;VENTOLIN HFA) 108 (90 BASE) MCG/ACT inhaler Inhale 1-2 puffs into the lungs every 6 (six) hours as needed for wheezing or shortness of breath. 02/08/14   Courtney Forcucci, PA-C  alum hydroxide-mag trisilicate (GAVISCON) 80-20 MG CHEW chewable tablet Chew 1 tablet by mouth 3 (three) times daily as needed for indigestion or heartburn.    Historical Provider, MD  Aspirin-Acetaminophen-Caffeine (GOODY HEADACHE PO) Take 1 packet by mouth daily as needed (for pain).    Historical Provider, MD  docusate sodium (COLACE) 100 MG capsule Take 100 mg by mouth daily as needed for mild constipation.  Historical Provider, MD    Allergies  Sulfa antibiotics  Triage Vitals: BP 121/72 mmHg  Pulse 78  Temp(Src) 98.1 F (36.7 C) (Oral)  Resp 20  Ht 5\' 3"  (1.6 m)  Wt 200 lb (90.719 kg)  BMI 35.44 kg/m2  SpO2 96%  Physical Exam CONSTITUTIONAL: Well developed/well nourished HEAD: Normocephalic/atraumatic EYES: EOMI/PERRL ENMT: Mucous membranes moist NECK: supple no meningeal signs, no bruising/edema noted to anterior neck CV: S1/S2 noted, no murmurs/rubs/gallops noted LUNGS: Lungs are clear to auscultation bilaterally, no apparent  distress ABDOMEN: soft, nontender, no rebound or guarding, bowel sounds noted throughout abdomen NEURO: Pt is awake/alert/appropriate, moves all extremitiesx4.  No facial droop.   EXTREMITIES: pulses normal/equal, distal pulses intact, able to move fingers of right hand without difficulty, no edema to RUE, incisions noted to right shoulder, clean, dry and intact, no erythema noted and no drainage noted SKIN: warm, color normal PSYCH: Anxious  ED Course  Procedures   DIAGNOSTIC STUDIES: Oxygen Saturation is 96% on RA, normal by my interpretation.    COORDINATION OF CARE: 12:54 AM Discussed treatment plan which includes Diluadid with pt at bedside and pt agreed to plan.  Medications  HYDROmorphone (DILAUDID) injection 2 mg (2 mg Intramuscular Given 08/07/14 0102)  oxyCODONE-acetaminophen (PERCOCET/ROXICET) 5-325 MG per tablet 1 tablet (1 tablet Oral Given 08/07/14 0148)   Pt improved after injection of dilaudid.  Reports down to about 4/10 and one percocet ordered She has no evidence of acute postop infection.  She is vascularly intact at this time I don't feel acute imaging required at this time She reports pain started as soon as nerve block resolved.   I feel she is stable for d/c home She is awake/alert, and we discussed need to call dr Dion [place] in the morning Pt/family agreeable with plan    MDM   Final diagnoses:  Acute post-operative pain     Nursing notes including past medical history and social history reviewed and considered in documentation   I personally performed the services described in this documentation, which was scribed in my presence. The recorded information has been reviewed and is accurate.       Zadie Rhineonald Tanisia Yokley, MD 08/07/14 [id number]

## 2014-08-07 NOTE — ED Notes (Signed)
Pt had surgery on right arm today, states the pain meds that she is prescribed are not taking care of the pain

## 2014-10-02 ENCOUNTER — Ambulatory Visit (HOSPITAL_COMMUNITY): Payer: Worker's Compensation | Attending: Orthopedic Surgery

## 2014-10-02 ENCOUNTER — Encounter (HOSPITAL_COMMUNITY): Payer: Self-pay

## 2014-10-02 DIAGNOSIS — M25611 Stiffness of right shoulder, not elsewhere classified: Secondary | ICD-10-CM | POA: Diagnosis present

## 2014-10-02 DIAGNOSIS — Z9889 Other specified postprocedural states: Secondary | ICD-10-CM | POA: Diagnosis present

## 2014-10-02 DIAGNOSIS — R29898 Other symptoms and signs involving the musculoskeletal system: Secondary | ICD-10-CM | POA: Insufficient documentation

## 2014-10-02 DIAGNOSIS — M629 Disorder of muscle, unspecified: Secondary | ICD-10-CM | POA: Diagnosis present

## 2014-10-02 DIAGNOSIS — M25511 Pain in right shoulder: Secondary | ICD-10-CM | POA: Insufficient documentation

## 2014-10-02 DIAGNOSIS — M6289 Other specified disorders of muscle: Secondary | ICD-10-CM

## 2014-10-02 NOTE — Therapy (Signed)
[facility] [address] [place], [place], [address] Phone: [phone]   Fax:  [phone]  Occupational Therapy Evaluation  Patient Details  Name: Deborah Baker MRN: [id number] Date of Birth: [birth year] Referring Provider:  Teryl Lucy, MD  Encounter Date: 10/02/2014      OT End of Session - 10/02/14 1635    Visit Number 1   Number of Visits 36   Date for OT Re-Evaluation 12/01/14  mini reassessment: 10/30/14   Authorization Type Worker's Comp   OT Start Time 1525   OT Stop Time 1605   OT Time Calculation (min) 40 min   Activity Tolerance Patient tolerated treatment well   Behavior During Therapy [facility] for tasks assessed/performed      Past Medical History  Diagnosis Date  . Hypertension   . Outbursts of anger   . Hyperlipemia     Past Surgical History  Procedure Laterality Date  . Rotator cuff repair      There were no vitals filed for this visit.  Visit Diagnosis:  S/P rotator cuff repair - Plan: Ot plan of care cert/re-cert  Right shoulder pain - Plan: Ot plan of care cert/re-cert  Shoulder weakness - Plan: Ot plan of care cert/re-cert  Tight fascia - Plan: Ot plan of care cert/re-cert  Shoulder stiffness, right - Plan: Ot plan of care cert/re-cert      Subjective Assessment - 10/02/14 1623    Subjective  S: I had to wait so long to get the sx that Dr. Dion Saucier said the longer I wait the worse it will be.    Pertinent History Patient is a 49 y/o female s/o right shoulder rotator cuff repair with sx completed on 08/06/14. Injury occured at work on Aprill 22, 2016 involving a tractor trailer. Patient presents today without sling stating that she no longer needs to wear it per Dr. Dion Saucier. Patient was not given any previous exercises to complete at home. Dr. Dion Saucier has referred patient to occupational therapy for evaluation and treatment.    Limitations 7-8 weeks post op (9/1-9/8): Wean from sling. PROM within  tolerance level. Completed AAROM, scapular theraband exercises.   8-9 weeks post op (9/8-9/15): UBE, AROM, ER in sidelying, prone extension, row, horizontal abd. 9-14 weeks post op (9/15-10/20): initiate light stretching, rythmic stabilization, closed chain with wall and floor, plyometrics, cybex rows/press, general strengthening   Special Tests FOTO to be completed at next session.    Patient Stated Goals To return to work and increase use of right arm.    Currently in Pain? Yes   Pain Score 5    Pain Location Shoulder   Pain Orientation Right   Pain Descriptors / Indicators Aching;Constant;Throbbing;Nagging;Operative site guarding   Pain Type Acute pain;Surgical pain   Pain Onset More than a month ago   Pain Frequency Constant   Pain Relieving Factors Pain medication and heat           [facility] OT Assessment - 10/02/14 1525    Assessment   Diagnosis Right rotator cuff repair   Onset Date --  injury -  05/22/14 surgery - 08/06/14   Assessment 10/22/14 - [place]   Prior Therapy None   Precautions   Precautions Shoulder   Type of Shoulder Precautions 7-8 weeks post op (9/1-9/8): Wean from sling. PROM within tolerance level. Completed AAROM, scapular theraband exercises.   8-9 weeks post op (9/8-9/15): UBE, AROM, ER in sidelying, prone extension, row, horizontal abd. 9-14 weeks post  op (9/15-10/20): initiate light stretching, rythmic stabilization, closed chain with wall and floor, plyometrics, cybex rows/press, general strengthening   Restrictions   Weight Bearing Restrictions Yes   Balance Screen   Has the patient fallen in the past 6 months Yes   How many times? 1  fell up the stairs   Has the patient had a decrease in activity level because of a fear of falling?  No   Is the patient reluctant to leave their home because of a fear of falling?  No   Home  Environment   Family/patient expects to be discharged to: Private residence   Prior Function   Level of Independence Independent    Vocation Full time employment   Vocation Requirements [employer]    Leisure Drives a dump truck   ADL   ADL comments Difficulty holding onto items in right hand, unable to donn/doff bra, raising  arm against gravity.   Mobility   Mobility Status Independent   Written Expression   Dominant Hand Right   Vision - History   Baseline Vision No visual deficits   Cognition   Overall Cognitive Status Within Functional Limits for tasks assessed   ROM / Strength   AROM / PROM / Strength AROM;PROM;Strength   Palpation   Palpation comment Max fascial restrictions in right upper arm, trapezius, and scapularis region.    AROM   Overall AROM  Unable to assess;Due to precautions   AROM Assessment Site Shoulder   Right/Left Shoulder Right   PROM   Overall PROM Comments Assessed supine. IR/ER adducted.   PROM Assessment Site Shoulder   Right/Left Shoulder Right   Right Shoulder Flexion 70 Degrees   Right Shoulder ABduction 70 Degrees   Right Shoulder Internal Rotation 60 Degrees   Right Shoulder External Rotation 0 Degrees   Strength   Overall Strength Unable to assess;Due to precautions   Overall Strength Comments Assessed supine. IR/ER adducted.    Strength Assessment Site Shoulder   Right/Left Shoulder Right                         OT Education - 10/02/14 1548    Education provided Yes   Education Details table slides, pendulums, AROM wrist and elbow exercises. Plan of care for therapy, prognosis, HEP   Person(s) Educated Patient   Methods Explanation;Demonstration;Handout   Comprehension Returned demonstration;Verbalized understanding          OT Short Term Goals - 10/02/14 1638    OT SHORT TERM GOAL #1   Title Patient will be educated and independent with HEP.   Time 6   Period Weeks   Status New   OT SHORT TERM GOAL #2   Title Patient will increase PROM to [facility] to increase ability to complete dressing tasks.    Time 6   Period Weeks   Status New    OT SHORT TERM GOAL #3   Title Patient will increase right shoulder strength to 3/5 to increase ability to hold onto light weight items.    Time 6   Period Weeks   Status New   OT SHORT TERM GOAL #4   Title Patient will decrease pain to 3/10 or less during daily tasks.   Time 6   Period Weeks   Status New   OT SHORT TERM GOAL #5   Title Patient will decrease fascial restrictions to mod amount.    Time 6   Period Weeks  Status New           OT Long Term Goals - 10/02/14 1640    OT LONG TERM GOAL #1   Title patient will return to highest level of independencen with all daily, leisure, and work related tasks.    Time 12   Period Weeks   Status New   OT LONG TERM GOAL #2   Title Patient will increase AROM to [facility] to increase ability to complete tasks above shoulder height.    Time 12   Period Weeks   Status New   OT LONG TERM GOAL #3   Title Patient will increase right shoulder strength to 4/5 to increase ability to return to work and perform required tasks.   Time 12   Period Weeks   Status New   OT LONG TERM GOAL #4   Title Patient will decrease pain level to 1/10 or less during daily tasks.   Time 12   Period Weeks   Status New   OT LONG TERM GOAL #5   Title Patient will decrease fascial restrictions to min amount.    Time 12   Period Weeks   Status New               Plan - 10/02/14 1636    Clinical Impression Statement A: Patient is a 49 y/o female s/p right rotator cuff repair causing increased pain and fascial restrictions and decreased strength and ROM resulting in difficulty completing daily, leisure, and work related tasks.    Pt will benefit from skilled therapeutic intervention in order to improve on the following deficits (Retired) Decreased strength;Pain;Impaired UE functional use;Increased fascial restricitons;Decreased range of motion   Rehab Potential Excellent   OT Frequency 3x / week   OT Duration 12 weeks   OT Treatment/Interventions  Self-care/ADL training;Scar mobilization;DME and/or AE instruction;Passive range of motion;Patient/family education;Cryotherapy;Electrical Stimulation;Moist Heat;Therapeutic activities;Therapeutic exercises;Manual Therapy   Plan P: Pt will benefit from skilled OT services to increase functional performance during daily tasks using RUE as dominant extremity. Treatment Plan: Follow protocol. Myofascial release, manual stretching, muscle energy technique, PROM, AAROM, AROM, general strengthening.    Consulted and Agree with Plan of Care Patient        Problem List There are no active problems to display for this patient.   Limmie Patricia, OTR/L,CBIS  [phone]  10/02/2014, 4:45 PM  [facility] [address] [place], [place], [address] Phone: [phone]   Fax:  [phone]

## 2014-10-02 NOTE — Patient Instructions (Signed)
TOWEL SLIDES COMPLETE FOR 1-3 MINUTES, 3-5 TIMES PER DAY  SHOULDER: Flexion On Table   Place hands on table, elbows straight. Move hips away from body. Press hands down into table. Hold ___ seconds. ___ reps per set, ___ sets per day, ___ days per week  Abduction (Passive)   With arm out to side, resting on table, lower head toward arm, keeping trunk away from table. Hold ____ seconds. Repeat ____ times. Do ____ sessions per day.  Copyright  VHI. All rights reserved.     Internal Rotation (Assistive)   Seated with elbow bent at right angle and held against side, slide arm on table surface in an inward arc. Repeat ____ times. Do ____ sessions per day. Activity: Use this motion to brush crumbs off the table.  Copyright  VHI. All rights reserved.    COMPLETE PENDULUM EXERCISES FOR 30 SECONDS TO A MINUTE EACH, 3-5 TIMES PER DAY. ROM: Pendulum (Side-to-Side)     http://orth.exer.us/792   Copyright  VHI. All rights reserved.  Pendulum Forward/Back   Bend forward 90 at waist, using table for support. Rock body forward and back to swing arm. Repeat ____ times. Do ____ sessions per day.  Copyright  VHI. All rights reserved.  Pendulum Circular   Bend forward 90 at waist, leaning on table for support. Rock body in a circular pattern to move arm clockwise ____ times then counterclockwise ____ times. Do ____ sessions per day.  Copyright  VHI. All rights reserved.  AROM: Wrist Extension   With right palm down, bend wrist up. Repeat 10____ times per set. Do ____ sets per session. Do __3__ sessions per day.  Copyright  VHI. All rights reserved.   AROM: Wrist Flexion   With right palm up, bend wrist up. Repeat ___10_ times per set. Do ____ sets per session. Do __3__ sessions per day.  Copyright  VHI. All rights reserved.   AROM: Forearm Pronation / Supination   With right arm in handshake position, slowly rotate palm down until stretch is felt. Relax. Then  rotate palm up until stretch is felt. Repeat __10__ times per set. Do ____ sets per session. Do __3__ sessions per day.  Copyright  VHI. All rights reserved.   AFlexion (Passive)   Use other hand to bend elbow, with thumb toward same shoulder. Do NOT force this motion. Hold ____ seconds. Repeat ____ times. Do ____ sessions per day.      

## 2014-10-06 ENCOUNTER — Ambulatory Visit (HOSPITAL_COMMUNITY): Payer: Worker's Compensation | Admitting: Occupational Therapy

## 2014-10-06 ENCOUNTER — Encounter (HOSPITAL_COMMUNITY): Payer: Self-pay | Admitting: Occupational Therapy

## 2014-10-06 DIAGNOSIS — M25611 Stiffness of right shoulder, not elsewhere classified: Secondary | ICD-10-CM

## 2014-10-06 DIAGNOSIS — Z9889 Other specified postprocedural states: Secondary | ICD-10-CM | POA: Diagnosis not present

## 2014-10-06 DIAGNOSIS — M25511 Pain in right shoulder: Secondary | ICD-10-CM

## 2014-10-06 DIAGNOSIS — M6289 Other specified disorders of muscle: Secondary | ICD-10-CM

## 2014-10-06 DIAGNOSIS — R29898 Other symptoms and signs involving the musculoskeletal system: Secondary | ICD-10-CM

## 2014-10-06 DIAGNOSIS — M629 Disorder of muscle, unspecified: Secondary | ICD-10-CM

## 2014-10-06 NOTE — Therapy (Signed)
[facility] [address] [place], [place], [address] Phone: [phone]   Fax:  [phone]  Occupational Therapy Treatment  Patient Details  Name: Deborah Baker MRN: [id number] Date of Birth: [birth year] Referring Provider:  Teryl Lucy, MD  Encounter Date: 10/06/2014      OT End of Session - 10/06/14 1241    Visit Number 2   Number of Visits 36   Date for OT Re-Evaluation 12/01/14  mini reassessment: 10/30/14   Authorization Type Worker's Comp   OT Start Time 1105   OT Stop Time 1146   OT Time Calculation (min) 41 min   Activity Tolerance Patient tolerated treatment well   Behavior During Therapy [facility] for tasks assessed/performed      Past Medical History  Diagnosis Date  . Hypertension   . Outbursts of anger   . Hyperlipemia     Past Surgical History  Procedure Laterality Date  . Rotator cuff repair      There were no vitals filed for this visit.  Visit Diagnosis:  Right shoulder pain  Shoulder weakness  Tight fascia  Shoulder stiffness, right      Subjective Assessment - 10/06/14 1108    Subjective  S: It is super sensitive right at the top of my shoulder.    Currently in Pain? Yes   Pain Score 4    Pain Location Shoulder   Pain Orientation Right   Pain Descriptors / Indicators Aching   Pain Type Acute pain;Surgical pain            [facility] OT Assessment - 10/06/14 1105    Assessment   Diagnosis Right rotator cuff repair   Precautions   Precautions Shoulder   Type of Shoulder Precautions 7-8 weeks post op (9/1-9/8): Wean from sling. PROM within tolerance level. Completed AAROM, scapular theraband exercises.   8-9 weeks post op (9/8-9/15): UBE, AROM, ER in sidelying, prone extension, row, horizontal abd. 9-14 weeks post op (9/15-10/20): initiate light stretching, rythmic stabilization, closed chain with wall and floor, plyometrics, cybex rows/press, general strengthening                   OT Treatments/Exercises (OP) - 10/06/14 1136    Exercises   Exercises Shoulder   Shoulder Exercises: Supine   Protraction PROM;10 reps   Horizontal ABduction PROM;10 reps   External Rotation PROM;10 reps   Internal Rotation PROM;10 reps   Flexion PROM;10 reps   ABduction PROM;10 reps   Shoulder Exercises: Seated   Elevation AROM;10 reps   Extension AROM;10 reps   Row AROM;10 reps   Shoulder Exercises: Therapy Ball   Flexion 10 reps   ABduction 10 reps   Manual Therapy   Manual Therapy Myofascial release   Myofascial Release Myofascial release to right upper arm, deltoid, trapezius, and scapularis regions to decrease pain and fascial restrictions and increase joint range of motion.                   OT Short Term Goals - 10/06/14 1244    OT SHORT TERM GOAL #1   Title Patient will be educated and independent with HEP.   Time 6   Period Weeks   Status On-going   OT SHORT TERM GOAL #2   Title Patient will increase PROM to [facility] to increase ability to complete dressing tasks.    Time 6   Period Weeks   Status On-going   OT SHORT TERM GOAL #3  Title Patient will increase right shoulder strength to 3/5 to increase ability to hold onto light weight items.    Time 6   Period Weeks   Status On-going   OT SHORT TERM GOAL #4   Title Patient will decrease pain to 3/10 or less during daily tasks.   Time 6   Period Weeks   Status On-going   OT SHORT TERM GOAL #5   Title Patient will decrease fascial restrictions to mod amount.    Time 6   Period Weeks   Status On-going           OT Long Term Goals - 10/06/14 1245    OT LONG TERM GOAL #1   Title patient will return to highest level of independencen with all daily, leisure, and work related tasks.    Time 12   Period Weeks   Status On-going   OT LONG TERM GOAL #2   Title Patient will increase AROM to [facility] to increase ability to complete tasks above shoulder height.    Time 12   Period Weeks   Status On-going    OT LONG TERM GOAL #3   Title Patient will increase right shoulder strength to 4/5 to increase ability to return to work and perform required tasks.   Time 12   Period Weeks   Status On-going   OT LONG TERM GOAL #4   Title Patient will decrease pain level to 1/10 or less during daily tasks.   Time 12   Period Weeks   Status On-going   OT LONG TERM GOAL #5   Title Patient will decrease fascial restrictions to min amount.    Time 12   Period Weeks   Status On-going               Plan - 10/06/14 1242    Clinical Impression Statement A: Initiated myofascial release, PROM, scapular AROM this session. Pt demonstrates tenderness and is very sensitive along right upper arm and anterior deltoid region. Completed FOTO: 39/100 (61% impairment).    Plan P: Continue working to increase range during PROM, add pulleys if able. Provide copy of evaluation.         Problem List There are no active problems to display for this patient.   Ezra Sites, OTR/L  [phone]  10/06/2014, 12:47 PM  [facility] [address] [place], [place], [address] Phone: [phone]   Fax:  [phone]

## 2014-10-07 ENCOUNTER — Ambulatory Visit (HOSPITAL_COMMUNITY): Payer: Worker's Compensation

## 2014-10-07 ENCOUNTER — Encounter (HOSPITAL_COMMUNITY): Payer: Self-pay

## 2014-10-07 DIAGNOSIS — R29898 Other symptoms and signs involving the musculoskeletal system: Secondary | ICD-10-CM

## 2014-10-07 DIAGNOSIS — Z9889 Other specified postprocedural states: Secondary | ICD-10-CM | POA: Diagnosis not present

## 2014-10-07 DIAGNOSIS — M25511 Pain in right shoulder: Secondary | ICD-10-CM

## 2014-10-07 DIAGNOSIS — M25611 Stiffness of right shoulder, not elsewhere classified: Secondary | ICD-10-CM

## 2014-10-07 DIAGNOSIS — M629 Disorder of muscle, unspecified: Secondary | ICD-10-CM

## 2014-10-07 DIAGNOSIS — M6289 Other specified disorders of muscle: Secondary | ICD-10-CM

## 2014-10-07 NOTE — Therapy (Signed)
[place], [place], [address] Phone: [phone]   Fax:  [phone]  Occupational Therapy Treatment  Patient Details  Name: Deborah Baker MRN: [id number] Date of Birth: 03/30/1965 Referring Provider:  Marchia Bond, MD  Encounter Date: 10/07/2014      OT End of Session - 10/07/14 1229    Visit Number 3   Number of Visits 36   Date for OT Re-Evaluation 12/01/14  mini reassessment: 10/30/14   Authorization Type Worker's Comp   OT Start Time 1155   OT Stop Time 1230   OT Time Calculation (min) 35 min   Activity Tolerance Patient tolerated treatment well   Behavior During Therapy [facility] for tasks assessed/performed      Past Medical History  Diagnosis Date  . Hypertension   . Outbursts of anger   . Hyperlipemia     Past Surgical History  Procedure Laterality Date  . Rotator cuff repair      There were no vitals filed for this visit.  Visit Diagnosis:  Right shoulder pain  Shoulder weakness  Tight fascia  Shoulder stiffness, right      Subjective Assessment - 10/07/14 1219    Subjective  S: I didn't do my exercises before I came.    Currently in Pain? Yes   Pain Score 5    Pain Location Shoulder   Pain Orientation Right   Pain Descriptors / Indicators Aching   Pain Type Acute pain;Surgical pain            [facility] OT Assessment - 10/07/14 1220    Assessment   Diagnosis Right rotator cuff repair   Precautions   Precautions Shoulder   Type of Shoulder Precautions 7-8 weeks post op (9/1-9/8): Wean from sling. PROM within tolerance level. Completed AAROM, scapular theraband exercises.   8-9 weeks post op (9/8-9/15): UBE, AROM, ER in sidelying, prone extension, row, horizontal abd. 9-14 weeks post op (9/15-10/20): initiate light stretching, rythmic stabilization, closed chain with wall and floor, plyometrics, cybex rows/press, general strengthening                  OT  Treatments/Exercises (OP) - 10/07/14 1221    Exercises   Exercises Shoulder   Shoulder Exercises: Supine   Protraction PROM;10 reps   Horizontal ABduction PROM;10 reps   External Rotation PROM;10 reps   Internal Rotation PROM;10 reps   Flexion PROM;10 reps   ABduction PROM;10 reps   Shoulder Exercises: Pulleys   Flexion 1 minute  standing   ABduction 1 minute  standing   Shoulder Exercises: Therapy Ball   Flexion 10 reps   ABduction 10 reps   Shoulder Exercises: Isometric Strengthening   Flexion Supine;5X5"   Extension Supine;5X5"   External Rotation Supine;5X5"   Internal Rotation Supine;5X5"   ABduction Supine;5X5"   ADduction Supine;5X5"   Manual Therapy   Manual Therapy Myofascial release;Muscle Energy Technique   Myofascial Release Myofascial release to right upper arm, deltoid, trapezius, and scapularis regions to decrease pain and fascial restrictions and increase joint range of motion.    Muscle Energy Technique Muscle energy technique to RUE to decrease muscle spasms and increase range of motion.                OT Education - 10/07/14 1227    Education provided Yes   Education Details Pt was given print out of evaluation with goals highlighted.    Person(s) Educated Patient   Methods Explanation;Demonstration  Comprehension Verbalized understanding          OT Short Term Goals - 10/06/14 1244    OT SHORT TERM GOAL #1   Title Patient will be educated and independent with HEP.   Time 6   Period Weeks   Status On-going   OT SHORT TERM GOAL #2   Title Patient will increase PROM to [facility] to increase ability to complete dressing tasks.    Time 6   Period Weeks   Status On-going   OT SHORT TERM GOAL #3   Title Patient will increase right shoulder strength to 3/5 to increase ability to hold onto light weight items.    Time 6   Period Weeks   Status On-going   OT SHORT TERM GOAL #4   Title Patient will decrease pain to 3/10 or less during daily  tasks.   Time 6   Period Weeks   Status On-going   OT SHORT TERM GOAL #5   Title Patient will decrease fascial restrictions to mod amount.    Time 6   Period Weeks   Status On-going           OT Long Term Goals - 10/06/14 1245    OT LONG TERM GOAL #1   Title patient will return to highest level of independencen with all daily, leisure, and work related tasks.    Time 12   Period Weeks   Status On-going   OT LONG TERM GOAL #2   Title Patient will increase AROM to [facility] to increase ability to complete tasks above shoulder height.    Time 12   Period Weeks   Status On-going   OT LONG TERM GOAL #3   Title Patient will increase right shoulder strength to 4/5 to increase ability to return to work and perform required tasks.   Time 12   Period Weeks   Status On-going   OT LONG TERM GOAL #4   Title Patient will decrease pain level to 1/10 or less during daily tasks.   Time 12   Period Weeks   Status On-going   OT LONG TERM GOAL #5   Title Patient will decrease fascial restrictions to min amount.    Time 12   Period Weeks   Status On-going               Plan - 10/07/14 1229    Clinical Impression Statement A: Patient arrived late to therapy session. Added isometric exercises and pulleys this session. Pt continues to have increase tightness with all ranges of shoulder although did have a great response to muscle energy technique when completed for ER.    Plan P: Increase therapy ball repepitions. Add thumb tacks and pro/ret/elev/dep.        Problem List There are no active problems to display for this patient.   Ailene Ravel, OTR/L,CBIS  [phone]  10/07/2014, 12:40 PM  [place] [address] [place], [place], [address] Phone: [phone]   Fax:  [phone]

## 2014-10-09 ENCOUNTER — Ambulatory Visit (HOSPITAL_COMMUNITY): Payer: Worker's Compensation

## 2014-10-12 ENCOUNTER — Ambulatory Visit (HOSPITAL_COMMUNITY): Payer: Worker's Compensation

## 2014-10-12 ENCOUNTER — Encounter (HOSPITAL_COMMUNITY): Payer: Self-pay

## 2014-10-12 DIAGNOSIS — Z9889 Other specified postprocedural states: Secondary | ICD-10-CM | POA: Diagnosis not present

## 2014-10-12 DIAGNOSIS — M6289 Other specified disorders of muscle: Secondary | ICD-10-CM

## 2014-10-12 DIAGNOSIS — M629 Disorder of muscle, unspecified: Secondary | ICD-10-CM

## 2014-10-12 DIAGNOSIS — M25511 Pain in right shoulder: Secondary | ICD-10-CM

## 2014-10-12 DIAGNOSIS — M25611 Stiffness of right shoulder, not elsewhere classified: Secondary | ICD-10-CM

## 2014-10-12 DIAGNOSIS — R29898 Other symptoms and signs involving the musculoskeletal system: Secondary | ICD-10-CM

## 2014-10-12 NOTE — Therapy (Signed)
[facility] [address] [place], [place], [address] Phone: [phone]   Fax:  [phone]  Occupational Therapy Treatment  Patient Details  Name: Deborah Baker MRN: [id number] Date of Birth: 04-20-1965 Referring Provider:  Teryl Lucy, MD  Encounter Date: 10/12/2014      OT End of Session - 10/12/14 1344    Visit Number 4   Number of Visits 36   Date for OT Re-Evaluation 12/01/14  mini reassessment: 10/30/14   Authorization Type Worker's Comp   OT Start Time 1305   OT Stop Time 1345   OT Time Calculation (min) 40 min   Activity Tolerance Patient tolerated treatment well   Behavior During Therapy [facility] for tasks assessed/performed      Past Medical History  Diagnosis Date  . Hypertension   . Outbursts of anger   . Hyperlipemia     Past Surgical History  Procedure Laterality Date  . Rotator cuff repair      There were no vitals filed for this visit.  Visit Diagnosis:  Right shoulder pain  Shoulder weakness  Tight fascia  Shoulder stiffness, right      Subjective Assessment - 10/12/14 1325    Subjective  S: I had to cancel on Friday because I was sick.    Currently in Pain? Yes   Pain Score 4    Pain Location Shoulder   Pain Orientation Right   Pain Descriptors / Indicators Aching   Pain Type Acute pain;Surgical pain            [facility] OT Assessment - 10/12/14 1328    Assessment   Diagnosis Right rotator cuff repair   Precautions   Precautions Shoulder   Type of Shoulder Precautions 7-8 weeks post op (9/1-9/8): Wean from sling. PROM within tolerance level. Completed AAROM, scapular theraband exercises.   8-9 weeks post op (9/8-9/15): UBE, AROM, ER in sidelying, prone extension, row, horizontal abd. 9-14 weeks post op (9/15-10/20): initiate light stretching, rythmic stabilization, closed chain with wall and floor, plyometrics, cybex rows/press, general strengthening                  OT  Treatments/Exercises (OP) - 10/12/14 1326    Exercises   Exercises Shoulder   Shoulder Exercises: Supine   Protraction PROM;10 reps   Horizontal ABduction PROM;10 reps   External Rotation PROM;10 reps   Internal Rotation PROM;10 reps   Flexion PROM;10 reps   ABduction PROM;10 reps   Shoulder Exercises: Seated   Elevation AROM;10 reps   Extension AROM;10 reps   Row AROM;10 reps   Shoulder Exercises: Pulleys   Flexion 1 minute  standing   ABduction 1 minute  standing   Shoulder Exercises: Therapy Ball   Flexion 15 reps   ABduction 15 reps   Shoulder Exercises: ROM/Strengthening   Thumb Tacks 1'   Anterior Glide 3x10"   Caudal Glide 3x10"   Prot/Ret//Elev/Dep 1'   Shoulder Exercises: Isometric Strengthening   Flexion Supine;5X5"   Extension Supine;5X5"   External Rotation Supine;5X5"   Internal Rotation Supine;5X5"   ABduction Supine;5X5"   ADduction Supine;5X5"   Manual Therapy   Manual Therapy Myofascial release   Myofascial Release Myofascial release to right upper arm, deltoid, trapezius, and scapularis regions to decrease pain and fascial restrictions and increase joint range of motion.                   OT Short Term Goals - 10/06/14 1244  OT SHORT TERM GOAL #1   Title Patient will be educated and independent with HEP.   Time 6   Period Weeks   Status On-going   OT SHORT TERM GOAL #2   Title Patient will increase PROM to [facility] to increase ability to complete dressing tasks.    Time 6   Period Weeks   Status On-going   OT SHORT TERM GOAL #3   Title Patient will increase right shoulder strength to 3/5 to increase ability to hold onto light weight items.    Time 6   Period Weeks   Status On-going   OT SHORT TERM GOAL #4   Title Patient will decrease pain to 3/10 or less during daily tasks.   Time 6   Period Weeks   Status On-going   OT SHORT TERM GOAL #5   Title Patient will decrease fascial restrictions to mod amount.    Time 6   Period Weeks    Status On-going           OT Long Term Goals - 10/06/14 1245    OT LONG TERM GOAL #1   Title patient will return to highest level of independencen with all daily, leisure, and work related tasks.    Time 12   Period Weeks   Status On-going   OT LONG TERM GOAL #2   Title Patient will increase AROM to [facility] to increase ability to complete tasks above shoulder height.    Time 12   Period Weeks   Status On-going   OT LONG TERM GOAL #3   Title Patient will increase right shoulder strength to 4/5 to increase ability to return to work and perform required tasks.   Time 12   Period Weeks   Status On-going   OT LONG TERM GOAL #4   Title Patient will decrease pain level to 1/10 or less during daily tasks.   Time 12   Period Weeks   Status On-going   OT LONG TERM GOAL #5   Title Patient will decrease fascial restrictions to min amount.    Time 12   Period Weeks   Status On-going               Plan - 10/12/14 1344    Clinical Impression Statement A: Added thumb tacks, pro/ret/elev/dep exercises, and wall washing. Pt has some slight pain during passive stretching and new exercises. Overall, she tolerated new exercises.   Plan P: Attempt AA/ROM supine if patient is able to tolerate.        Problem List There are no active problems to display for this patient.   Ailene Ravel, OTR/L,CBIS  [phone]  10/12/2014, 4:38 PM  [place] [address] [place], [place], [address] Phone: [phone]   Fax:  [phone]

## 2014-10-14 ENCOUNTER — Ambulatory Visit (HOSPITAL_COMMUNITY): Payer: Worker's Compensation | Admitting: Occupational Therapy

## 2014-10-14 ENCOUNTER — Encounter (HOSPITAL_COMMUNITY): Payer: Self-pay | Admitting: Occupational Therapy

## 2014-10-14 DIAGNOSIS — M25511 Pain in right shoulder: Secondary | ICD-10-CM

## 2014-10-14 DIAGNOSIS — M25611 Stiffness of right shoulder, not elsewhere classified: Secondary | ICD-10-CM

## 2014-10-14 DIAGNOSIS — M629 Disorder of muscle, unspecified: Secondary | ICD-10-CM

## 2014-10-14 DIAGNOSIS — R29898 Other symptoms and signs involving the musculoskeletal system: Secondary | ICD-10-CM

## 2014-10-14 DIAGNOSIS — M6289 Other specified disorders of muscle: Secondary | ICD-10-CM

## 2014-10-14 DIAGNOSIS — Z9889 Other specified postprocedural states: Secondary | ICD-10-CM | POA: Diagnosis not present

## 2014-10-14 NOTE — Therapy (Signed)
[place], [place], [address] Phone: [phone]   Fax:  [phone]  Occupational Therapy Treatment  Patient Details  Name: Deborah Baker MRN: [id number] Date of Birth: 14-Mar-1965 Referring Provider:  Marchia Bond, MD  Encounter Date: 10/14/2014      OT End of Session - 10/14/14 1150    Visit Number 5   Number of Visits 36   Date for OT Re-Evaluation 12/01/14  mini reassessment: 10/30/14   Authorization Type Worker's Comp   OT Start Time 1102   OT Stop Time 1146   OT Time Calculation (min) 44 min   Activity Tolerance Patient tolerated treatment well   Behavior During Therapy [facility] for tasks assessed/performed      Past Medical History  Diagnosis Date  . Hypertension   . Outbursts of anger   . Hyperlipemia     Past Surgical History  Procedure Laterality Date  . Rotator cuff repair      There were no vitals filed for this visit.  Visit Diagnosis:  Right shoulder pain  Shoulder weakness  Tight fascia  Shoulder stiffness, right      Subjective Assessment - 10/14/14 1103    Subjective  S: She gave me a workout last session.    Currently in Pain? Yes   Pain Score 2    Pain Location Shoulder   Pain Orientation Right   Pain Descriptors / Indicators Aching   Pain Type Acute pain            [facility] OT Assessment - 10/14/14 1149    Assessment   Diagnosis Right rotator cuff repair   Precautions   Precautions Shoulder   Type of Shoulder Precautions 7-8 weeks post op (9/1-9/8): Wean from sling. PROM within tolerance level. Completed AAROM, scapular theraband exercises.   8-9 weeks post op (9/8-9/15): UBE, AROM, ER in sidelying, prone extension, row, horizontal abd. 9-14 weeks post op (9/15-10/20): initiate light stretching, rythmic stabilization, closed chain with wall and floor, plyometrics, cybex rows/press, general strengthening                  OT Treatments/Exercises (OP) -  10/14/14 1104    Exercises   Exercises Shoulder   Shoulder Exercises: Supine   Protraction PROM;AAROM;10 reps   Horizontal ABduction PROM;AAROM;10 reps   External Rotation PROM;AAROM;10 reps   Internal Rotation PROM;AAROM;10 reps   Flexion PROM;AAROM;10 reps   ABduction PROM;10 reps   Shoulder Exercises: Seated   Elevation AROM;12 reps   Extension AROM;12 reps   Row AROM;12 reps   Shoulder Exercises: Pulleys   Flexion 1 minute  standing   ABduction 1 minute  standing   Shoulder Exercises: ROM/Strengthening   Thumb Tacks 1'   Prot/Ret//Elev/Dep 1'   Manual Therapy   Manual Therapy Myofascial release   Myofascial Release Myofascial release to right upper arm, deltoid, trapezius, and scapularis regions to decrease pain and fascial restrictions and increase joint range of motion.                   OT Short Term Goals - 10/06/14 1244    OT SHORT TERM GOAL #1   Title Patient will be educated and independent with HEP.   Time 6   Period Weeks   Status On-going   OT SHORT TERM GOAL #2   Title Patient will increase PROM to [facility] to increase ability to complete dressing tasks.    Time 6   Period Weeks  Status On-going   OT SHORT TERM GOAL #3   Title Patient will increase right shoulder strength to 3/5 to increase ability to hold onto light weight items.    Time 6   Period Weeks   Status On-going   OT SHORT TERM GOAL #4   Title Patient will decrease pain to 3/10 or less during daily tasks.   Time 6   Period Weeks   Status On-going   OT SHORT TERM GOAL #5   Title Patient will decrease fascial restrictions to mod amount.    Time 6   Period Weeks   Status On-going           OT Long Term Goals - 10/06/14 1245    OT LONG TERM GOAL #1   Title patient will return to highest level of independencen with all daily, leisure, and work related tasks.    Time 12   Period Weeks   Status On-going   OT LONG TERM GOAL #2   Title Patient will increase AROM to [facility] to  increase ability to complete tasks above shoulder height.    Time 12   Period Weeks   Status On-going   OT LONG TERM GOAL #3   Title Patient will increase right shoulder strength to 4/5 to increase ability to return to work and perform required tasks.   Time 12   Period Weeks   Status On-going   OT LONG TERM GOAL #4   Title Patient will decrease pain level to 1/10 or less during daily tasks.   Time 12   Period Weeks   Status On-going   OT LONG TERM GOAL #5   Title Patient will decrease fascial restrictions to min amount.    Time 12   Period Weeks   Status On-going               Plan - 10/14/14 1150    Clinical Impression Statement A: Added AAROM in supine, with the exception of abduction. Pt reports painful catching sensation during abduction attempt. Pt tolerated treatment well.    Plan P: Cont AAROM exercises, resume wall wash. Attempt abduction AAROM again.         Problem List There are no active problems to display for this patient.   Guadelupe Sabin, OTR/L  [phone]  10/14/2014, 11:52 AM  [place], [place], [address] Phone: [phone]   Fax:  [phone]

## 2014-10-16 ENCOUNTER — Ambulatory Visit (HOSPITAL_COMMUNITY): Payer: Worker's Compensation | Admitting: Occupational Therapy

## 2014-10-19 ENCOUNTER — Ambulatory Visit (HOSPITAL_COMMUNITY): Payer: Worker's Compensation | Admitting: Specialist

## 2014-10-19 DIAGNOSIS — M6289 Other specified disorders of muscle: Secondary | ICD-10-CM

## 2014-10-19 DIAGNOSIS — M25511 Pain in right shoulder: Secondary | ICD-10-CM

## 2014-10-19 DIAGNOSIS — M629 Disorder of muscle, unspecified: Secondary | ICD-10-CM

## 2014-10-19 DIAGNOSIS — Z9889 Other specified postprocedural states: Secondary | ICD-10-CM | POA: Diagnosis not present

## 2014-10-19 DIAGNOSIS — R29898 Other symptoms and signs involving the musculoskeletal system: Secondary | ICD-10-CM

## 2014-10-19 NOTE — Therapy (Signed)
[facility] [address] [place], [place], [address] Phone: [phone]   Fax:  [phone]  Occupational Therapy Treatment  Patient Details  Name: Deborah Baker MRN: [id number] Date of Birth: Apr 08, 1965 Referring Provider:  Teryl Lucy, MD  Encounter Date: 10/19/2014      OT End of Session - 10/19/14 1543    Visit Number 6   Number of Visits 36   Date for OT Re-Evaluation 12/01/14  mini reassess 9/30   Authorization Type Worker's Comp   OT Start Time 1350   OT Stop Time 1435   OT Time Calculation (min) 45 min   Activity Tolerance Patient tolerated treatment well   Behavior During Therapy [facility] for tasks assessed/performed      Past Medical History  Diagnosis Date  . Hypertension   . Outbursts of anger   . Hyperlipemia     Past Surgical History  Procedure Laterality Date  . Rotator cuff repair      There were no vitals filed for this visit.  Visit Diagnosis:  Right shoulder pain  Shoulder weakness  Tight fascia      Subjective Assessment - 10/19/14 1354    Subjective  S:  It feels pretty good today.   Currently in Pain? Yes   Pain Score 1    Pain Location Shoulder   Pain Orientation Right   Pain Descriptors / Indicators Aching   Pain Type Acute pain            [facility] OT Assessment - 10/19/14 0001    Assessment   Diagnosis Right rotator cuff repair   Precautions   Precautions Shoulder   Type of Shoulder Precautions 7-8 weeks post op (9/1-9/8): Wean from sling. PROM within tolerance level. Completed AAROM, scapular theraband exercises.   8-9 weeks post op (9/8-9/15): UBE, AROM, ER in sidelying, prone extension, row, horizontal abd. 9-14 weeks post op (9/15-10/20): initiate light stretching, rythmic stabilization, closed chain with wall and floor, plyometrics, cybex rows/press, general strengthening                  OT Treatments/Exercises (OP) - 10/19/14 0001    Shoulder Exercises: Supine    Protraction PROM;10 reps;AAROM;15 reps   Horizontal ABduction PROM;10 reps;AAROM;15 reps   External Rotation PROM;10 reps;AAROM;15 reps   Internal Rotation PROM;10 reps;AAROM;15 reps   Flexion PROM;10 reps;AAROM;15 reps   ABduction PROM;10 reps;AAROM;15 reps   Ultrasound   Ultrasound Location right anterior upper arm along biceps tendon   Ultrasound Parameters 1.5 w/cm2 5 min continuous   Ultrasound Goals Pain  decrease restrictions/swelling   Manual Therapy   Manual Therapy Myofascial release   Myofascial Release Myofascial release to right upper arm, deltoid, trapezius, and scapularis regions to decrease pain and fascial restrictions and increase joint range of motion.                   OT Short Term Goals - 10/06/14 1244    OT SHORT TERM GOAL #1   Title Patient will be educated and independent with HEP.   Time 6   Period Weeks   Status On-going   OT SHORT TERM GOAL #2   Title Patient will increase PROM to [facility] to increase ability to complete dressing tasks.    Time 6   Period Weeks   Status On-going   OT SHORT TERM GOAL #3   Title Patient will increase right shoulder strength to 3/5 to increase ability to hold onto light  weight items.    Time 6   Period Weeks   Status On-going   OT SHORT TERM GOAL #4   Title Patient will decrease pain to 3/10 or less during daily tasks.   Time 6   Period Weeks   Status On-going   OT SHORT TERM GOAL #5   Title Patient will decrease fascial restrictions to mod amount.    Time 6   Period Weeks   Status On-going           OT Long Term Goals - 10/06/14 1245    OT LONG TERM GOAL #1   Title patient will return to highest level of independencen with all daily, leisure, and work related tasks.    Time 12   Period Weeks   Status On-going   OT LONG TERM GOAL #2   Title Patient will increase AROM to [facility] to increase ability to complete tasks above shoulder height.    Time 12   Period Weeks   Status On-going   OT LONG TERM  GOAL #3   Title Patient will increase right shoulder strength to 4/5 to increase ability to return to work and perform required tasks.   Time 12   Period Weeks   Status On-going   OT LONG TERM GOAL #4   Title Patient will decrease pain level to 1/10 or less during daily tasks.   Time 12   Period Weeks   Status On-going   OT LONG TERM GOAL #5   Title Patient will decrease fascial restrictions to min amount.    Time 12   Period Weeks   Status On-going               Plan - 10/19/14 1544    Clinical Impression Statement A:  patient extremely sensetive to touch along biceps tendon, noted swelling and tenderness.  Added [place] to decrease pain and restrictions/edema in this region. less difficulty with abduction after [place] intervention.    Plan P:  follow up on ultrasound.  resume wall wash, add seated AAROM.        Problem List There are no active problems to display for this patient.   Shirlean Mylar, OTR/L [phone]  10/19/2014, 3:46 PM  [facility] [address] [place], [place], [address] Phone: [phone]   Fax:  [phone]

## 2014-10-21 ENCOUNTER — Ambulatory Visit (HOSPITAL_COMMUNITY): Payer: Worker's Compensation

## 2014-10-21 ENCOUNTER — Encounter (HOSPITAL_COMMUNITY): Payer: Self-pay

## 2014-10-21 DIAGNOSIS — M25511 Pain in right shoulder: Secondary | ICD-10-CM

## 2014-10-21 DIAGNOSIS — R29898 Other symptoms and signs involving the musculoskeletal system: Secondary | ICD-10-CM

## 2014-10-21 DIAGNOSIS — M629 Disorder of muscle, unspecified: Secondary | ICD-10-CM

## 2014-10-21 DIAGNOSIS — M6289 Other specified disorders of muscle: Secondary | ICD-10-CM

## 2014-10-21 DIAGNOSIS — Z9889 Other specified postprocedural states: Secondary | ICD-10-CM | POA: Diagnosis not present

## 2014-10-21 DIAGNOSIS — M25611 Stiffness of right shoulder, not elsewhere classified: Secondary | ICD-10-CM

## 2014-10-21 NOTE — Therapy (Signed)
[facility] [address] [place], [place], [address] Phone: [phone]   Fax:  [phone]  Occupational Therapy Treatment  Patient Details  Name: Deborah Baker MRN: [id number] Date of Birth: 1965-04-22 Referring Provider:  Teryl Lucy, MD  Encounter Date: 10/21/2014      OT End of Session - 10/21/14 1214    Visit Number 7   Number of Visits 36   Date for OT Re-Evaluation 12/01/14  mini reassess 9/30   Authorization Type Worker's Comp   OT Start Time 1104   OT Stop Time 1145   OT Time Calculation (min) 41 min   Activity Tolerance Patient tolerated treatment well   Behavior During Therapy [facility] for tasks assessed/performed      Past Medical History  Diagnosis Date  . Hypertension   . Outbursts of anger   . Hyperlipemia     Past Surgical History  Procedure Laterality Date  . Rotator cuff repair      There were no vitals filed for this visit.  Visit Diagnosis:  Right shoulder pain  Shoulder weakness  Tight fascia  Shoulder stiffness, right      Subjective Assessment - 10/21/14 1212    Subjective  S: i see Dr Dion Saucier tomorrow.    Currently in Pain? Yes   Pain Score 1    Pain Location Shoulder   Pain Orientation Right   Pain Descriptors / Indicators Aching   Pain Type Acute pain            [facility] OT Assessment - 10/21/14 1120    Assessment   Diagnosis Right rotator cuff repair   Precautions   Precautions Shoulder   Type of Shoulder Precautions 7-8 weeks post op (9/1-9/8): Wean from sling. PROM within tolerance level. Completed AAROM, scapular theraband exercises.   8-9 weeks post op (9/8-9/15): UBE, AROM, ER in sidelying, prone extension, row, horizontal abd. 9-14 weeks post op (9/15-10/20): initiate light stretching, rythmic stabilization, closed chain with wall and floor, plyometrics, cybex rows/press, general strengthening   AROM   Overall AROM Comments A/ROM was not measured at evaluation.  Measurements today taken supine. ir/ER adducted.   AROM Assessment Site Shoulder   Right/Left Shoulder Right   Right Shoulder Flexion 130 Degrees   Right Shoulder ABduction 90 Degrees   Right Shoulder Internal Rotation 90 Degrees   Right Shoulder External Rotation 44 Degrees   PROM   Overall PROM Comments Assessed supine. ir/ER adducted.   PROM Assessment Site Shoulder   Right/Left Shoulder Right   Right Shoulder Flexion 133 Degrees  eval: 70   Right Shoulder ABduction 90 Degrees  eval: 70   Right Shoulder Internal Rotation 90 Degrees  eval: 60   Right Shoulder External Rotation 36 Degrees  eval: 0                  OT Treatments/Exercises (OP) - 10/21/14 1125    Exercises   Exercises Shoulder   Shoulder Exercises: Supine   Protraction PROM;10 reps;AAROM;15 reps   Horizontal ABduction PROM;10 reps;AAROM;15 reps   External Rotation PROM;10 reps;AAROM;15 reps   Internal Rotation PROM;10 reps;AAROM;15 reps   Flexion PROM;10 reps;AAROM;15 reps   ABduction PROM;AAROM;10 reps   Shoulder Exercises: Standing   Protraction AAROM;10 reps   Horizontal ABduction AAROM;10 reps   External Rotation AAROM;10 reps   Internal Rotation AAROM;10 reps   Flexion AAROM;10 reps   ABduction AAROM;10 reps   Shoulder Exercises: ROM/Strengthening   Thumb Tacks 1'  Manual Therapy   Manual Therapy Myofascial release   Myofascial Release Myofascial release to right upper arm, deltoid, trapezius, and scapularis regions to decrease pain and fascial restrictions and increase joint range of motion.                   OT Short Term Goals - 10/06/14 1244    OT SHORT TERM GOAL #1   Title Patient will be educated and independent with HEP.   Time 6   Period Weeks   Status On-going   OT SHORT TERM GOAL #2   Title Patient will increase PROM to [facility] to increase ability to complete dressing tasks.    Time 6   Period Weeks   Status On-going   OT SHORT TERM GOAL #3   Title Patient will  increase right shoulder strength to 3/5 to increase ability to hold onto light weight items.    Time 6   Period Weeks   Status On-going   OT SHORT TERM GOAL #4   Title Patient will decrease pain to 3/10 or less during daily tasks.   Time 6   Period Weeks   Status On-going   OT SHORT TERM GOAL #5   Title Patient will decrease fascial restrictions to mod amount.    Time 6   Period Weeks   Status On-going           OT Long Term Goals - 10/06/14 1245    OT LONG TERM GOAL #1   Title patient will return to highest level of independencen with all daily, leisure, and work related tasks.    Time 12   Period Weeks   Status On-going   OT LONG TERM GOAL #2   Title Patient will increase AROM to [facility] to increase ability to complete tasks above shoulder height.    Time 12   Period Weeks   Status On-going   OT LONG TERM GOAL #3   Title Patient will increase right shoulder strength to 4/5 to increase ability to return to work and perform required tasks.   Time 12   Period Weeks   Status On-going   OT LONG TERM GOAL #4   Title Patient will decrease pain level to 1/10 or less during daily tasks.   Time 12   Period Weeks   Status On-going   OT LONG TERM GOAL #5   Title Patient will decrease fascial restrictions to min amount.    Time 12   Period Weeks   Status On-going               Plan - 10/21/14 1214    Clinical Impression Statement A: Pt states that [place] did not have too much of an impact on her swelling. Measurements take today for MD appt tomorrow. Added AA/ROM standing and patient was able to complete with increased muscle fatigue.    Plan P: Continue with missed exercises.         Problem List There are no active problems to display for this patient.    Limmie Patricia, OTR/L,CBIS  [phone]  10/21/2014, 12:23 PM  [facility] [address] [place], [place], [address] Phone: [phone]   Fax:   [phone]

## 2014-10-23 ENCOUNTER — Ambulatory Visit (HOSPITAL_COMMUNITY): Payer: Worker's Compensation

## 2014-10-23 DIAGNOSIS — R29898 Other symptoms and signs involving the musculoskeletal system: Secondary | ICD-10-CM

## 2014-10-23 DIAGNOSIS — M25511 Pain in right shoulder: Secondary | ICD-10-CM

## 2014-10-23 DIAGNOSIS — M629 Disorder of muscle, unspecified: Secondary | ICD-10-CM

## 2014-10-23 DIAGNOSIS — M6289 Other specified disorders of muscle: Secondary | ICD-10-CM

## 2014-10-23 DIAGNOSIS — M25611 Stiffness of right shoulder, not elsewhere classified: Secondary | ICD-10-CM

## 2014-10-23 DIAGNOSIS — Z9889 Other specified postprocedural states: Secondary | ICD-10-CM | POA: Diagnosis not present

## 2014-10-23 NOTE — Therapy (Signed)
[facility] [address] [place], [place], [address] Phone: [phone]   Fax:  [phone]  Occupational Therapy Treatment  Patient Details  Name: Deborah Baker MRN: [id number] Date of Birth: 1965/05/10 Referring Provider:  Benita Stabile, MD  Encounter Date: 10/23/2014      OT End of Session - 10/23/14 1221    Visit Number 8   Number of Visits 36   Date for OT Re-Evaluation 12/01/14  mini reassess 9/30   Authorization Type Worker's Comp   OT Start Time 1100   OT Stop Time 1145   OT Time Calculation (min) 45 min   Activity Tolerance Patient tolerated treatment well   Behavior During Therapy [facility] for tasks assessed/performed      Past Medical History  Diagnosis Date  . Hypertension   . Outbursts of anger   . Hyperlipemia     Past Surgical History  Procedure Laterality Date  . Rotator cuff repair      There were no vitals filed for this visit.  Visit Diagnosis:  Right shoulder pain  Shoulder weakness  Tight fascia  Shoulder stiffness, right          [facility] OT Assessment - 10/23/14 1128    Assessment   Diagnosis Right rotator cuff repair   Precautions   Precautions Shoulder   Type of Shoulder Precautions 7-8 weeks post op (9/1-9/8): Wean from sling. PROM within tolerance level. Completed AAROM, scapular theraband exercises.   8-9 weeks post op (9/8-9/15): UBE, AROM, ER in sidelying, prone extension, row, horizontal abd. 9-14 weeks post op (9/15-10/20): initiate light stretching, rythmic stabilization, closed chain with wall and floor, plyometrics, cybex rows/press, general strengthening                  OT Treatments/Exercises (OP) - 10/23/14 1128    Exercises   Exercises Shoulder   Shoulder Exercises: Supine   Protraction PROM;10 reps;AAROM;15 reps   Horizontal ABduction PROM;10 reps;AAROM;15 reps   External Rotation PROM;10 reps;AAROM;15 reps   Internal Rotation PROM;10 reps;AAROM;15 reps   Flexion PROM;10 reps;AAROM;15 reps   ABduction PROM;AAROM;10 reps   Shoulder Exercises: Standing   Protraction AAROM;10 reps   Horizontal ABduction AAROM;10 reps   External Rotation AAROM;10 reps   Internal Rotation AAROM;10 reps   Flexion AAROM;10 reps   ABduction AAROM;10 reps   Shoulder Exercises: Pulleys   Flexion 1 minute  standing   ABduction 1 minute  standing   Shoulder Exercises: ROM/Strengthening   Caudal Glide 10X no rest breaks.   Proximal Shoulder Strengthening, Supine 10X no rest breaks   Manual Therapy   Manual Therapy Myofascial release   Myofascial Release Myofascial release to right upper arm, deltoid, trapezius, and scapularis regions to decrease pain and fascial restrictions and increase joint range of motion.                   OT Short Term Goals - 10/06/14 1244    OT SHORT TERM GOAL #1   Title Patient will be educated and independent with HEP.   Time 6   Period Weeks   Status On-going   OT SHORT TERM GOAL #2   Title Patient will increase PROM to [facility] to increase ability to complete dressing tasks.    Time 6   Period Weeks   Status On-going   OT SHORT TERM GOAL #3   Title Patient will increase right shoulder strength to 3/5 to increase ability to hold onto light  weight items.    Time 6   Period Weeks   Status On-going   OT SHORT TERM GOAL #4   Title Patient will decrease pain to 3/10 or less during daily tasks.   Time 6   Period Weeks   Status On-going   OT SHORT TERM GOAL #5   Title Patient will decrease fascial restrictions to mod amount.    Time 6   Period Weeks   Status On-going           OT Long Term Goals - 10/06/14 1245    OT LONG TERM GOAL #1   Title patient will return to highest level of independencen with all daily, leisure, and work related tasks.    Time 12   Period Weeks   Status On-going   OT LONG TERM GOAL #2   Title Patient will increase AROM to [facility] to increase ability to complete tasks above shoulder height.     Time 12   Period Weeks   Status On-going   OT LONG TERM GOAL #3   Title Patient will increase right shoulder strength to 4/5 to increase ability to return to work and perform required tasks.   Time 12   Period Weeks   Status On-going   OT LONG TERM GOAL #4   Title Patient will decrease pain level to 1/10 or less during daily tasks.   Time 12   Period Weeks   Status On-going   OT LONG TERM GOAL #5   Title Patient will decrease fascial restrictions to min amount.    Time 12   Period Weeks   Status On-going               Plan - 10/23/14 1221    Clinical Impression Statement A: Added proximal shoulder strengthening. Patient completed without pain. Muscle fatigue noted during exercises requiring patient to take breaks when she needed to,   Plan P: Add wall wash.        Problem List There are no active problems to display for this patient.   Limmie Patricia, OTR/L,CBIS  [phone]  10/23/2014, 12:23 PM  [facility] [address] [place], [place], [address] Phone: [phone]   Fax:  [phone]

## 2014-10-26 ENCOUNTER — Encounter (HOSPITAL_COMMUNITY): Payer: Self-pay

## 2014-10-26 ENCOUNTER — Ambulatory Visit (HOSPITAL_COMMUNITY): Payer: Worker's Compensation

## 2014-10-26 DIAGNOSIS — Z9889 Other specified postprocedural states: Secondary | ICD-10-CM | POA: Diagnosis not present

## 2014-10-26 DIAGNOSIS — R29898 Other symptoms and signs involving the musculoskeletal system: Secondary | ICD-10-CM

## 2014-10-26 DIAGNOSIS — M25611 Stiffness of right shoulder, not elsewhere classified: Secondary | ICD-10-CM

## 2014-10-26 DIAGNOSIS — M25511 Pain in right shoulder: Secondary | ICD-10-CM

## 2014-10-26 DIAGNOSIS — M6289 Other specified disorders of muscle: Secondary | ICD-10-CM

## 2014-10-26 DIAGNOSIS — M629 Disorder of muscle, unspecified: Secondary | ICD-10-CM

## 2014-10-26 NOTE — Therapy (Signed)
[facility] [address] [place], [place], [address] Phone: [phone]   Fax:  [phone]  Occupational Therapy Treatment  Patient Details  Name: Deborah Baker MRN: [id number] Date of Birth: [birth year] Referring Hutch Rhett:  Teryl Lucy, MD  Encounter Date: 10/26/2014      OT End of Session - 10/26/14 1215    Visit Number 9   Number of Visits 36   Date for OT Re-Evaluation 12/01/14  mini reassess 9/30   Authorization Type Worker's Comp   OT Start Time 1100   OT Stop Time 1145   OT Time Calculation (min) 45 min   Activity Tolerance Patient tolerated treatment well   Behavior During Therapy [facility] for tasks assessed/performed      Past Medical History  Diagnosis Date  . Hypertension   . Outbursts of anger   . Hyperlipemia     Past Surgical History  Procedure Laterality Date  . Rotator cuff repair      There were no vitals filed for this visit.  Visit Diagnosis:  Shoulder weakness  Right shoulder pain  Tight fascia  Shoulder stiffness, right      Subjective Assessment - 10/26/14 1127    Subjective  S: I don't know what happened but I woke up Saturday and it was just killing me.   Currently in Pain? Yes   Pain Score 4    Pain Location Shoulder   Pain Orientation Right   Pain Descriptors / Indicators Aching   Pain Type Acute pain            [facility] OT Assessment - 10/26/14 1127    Assessment   Diagnosis Right rotator cuff repair   Precautions   Precautions Shoulder   Type of Shoulder Precautions 7-8 weeks post op (9/1-9/8): Wean from sling. PROM within tolerance level. Completed AAROM, scapular theraband exercises.   8-9 weeks post op (9/8-9/15): UBE, AROM, ER in sidelying, prone extension, row, horizontal abd. 9-14 weeks post op (9/15-10/20): initiate light stretching, rythmic stabilization, closed chain with wall and floor, plyometrics, cybex rows/press, general strengthening                   OT Treatments/Exercises (OP) - 10/26/14 1128    Exercises   Exercises Shoulder   Shoulder Exercises: Supine   Protraction PROM;10 reps;AAROM;15 reps   Horizontal ABduction PROM;10 reps;AAROM;15 reps   External Rotation PROM;10 reps;AAROM;15 reps   Internal Rotation PROM;10 reps;AAROM;15 reps   Flexion PROM;10 reps;AAROM;15 reps   ABduction PROM;10 reps;AAROM  9X   Shoulder Exercises: Standing   Protraction AAROM;10 reps   Horizontal ABduction AAROM;10 reps   External Rotation AAROM;10 reps   Internal Rotation AAROM;10 reps   Flexion AAROM;10 reps   ABduction AAROM;10 reps   Shoulder Exercises: Pulleys   Flexion 2 minutes  standing   ABduction 2 minutes  standing   Shoulder Exercises: ROM/Strengthening   Wall Wash 1'   Manual Therapy   Manual Therapy Myofascial release   Myofascial Release Myofascial release to right upper arm, deltoid, trapezius, and scapularis regions to decrease pain and fascial restrictions and increase joint range of motion.                   OT Short Term Goals - 10/06/14 1244    OT SHORT TERM GOAL #1   Title Patient will be educated and independent with HEP.   Time 6   Period Weeks   Status On-going   OT  SHORT TERM GOAL #2   Title Patient will increase PROM to [facility] to increase ability to complete dressing tasks.    Time 6   Period Weeks   Status On-going   OT SHORT TERM GOAL #3   Title Patient will increase right shoulder strength to 3/5 to increase ability to hold onto light weight items.    Time 6   Period Weeks   Status On-going   OT SHORT TERM GOAL #4   Title Patient will decrease pain to 3/10 or less during daily tasks.   Time 6   Period Weeks   Status On-going   OT SHORT TERM GOAL #5   Title Patient will decrease fascial restrictions to mod amount.    Time 6   Period Weeks   Status On-going           OT Long Term Goals - 10/06/14 1245    OT LONG TERM GOAL #1   Title patient will return to highest level of  independencen with all daily, leisure, and work related tasks.    Time 12   Period Weeks   Status On-going   OT LONG TERM GOAL #2   Title Patient will increase AROM to [facility] to increase ability to complete tasks above shoulder height.    Time 12   Period Weeks   Status On-going   OT LONG TERM GOAL #3   Title Patient will increase right shoulder strength to 4/5 to increase ability to return to work and perform required tasks.   Time 12   Period Weeks   Status On-going   OT LONG TERM GOAL #4   Title Patient will decrease pain level to 1/10 or less during daily tasks.   Time 12   Period Weeks   Status On-going   OT LONG TERM GOAL #5   Title Patient will decrease fascial restrictions to min amount.    Time 12   Period Weeks   Status On-going               Plan - 10/26/14 1215    Clinical Impression Statement A: Added wall wash. patient did report increased pain and tenderness during manual therapy and stretching. Increased time with pulleys.   Plan P: Increase reps with standing AA/ROM if able to tolerate.        Problem List There are no active problems to display for this patient.   Limmie Patricia, OTR/L,CBIS  [phone]  10/26/2014, 12:24 PM  [facility] [address] [place], [place], [address] Phone: [phone]   Fax:  [phone]

## 2014-10-28 ENCOUNTER — Ambulatory Visit (HOSPITAL_COMMUNITY): Payer: Worker's Compensation | Admitting: Occupational Therapy

## 2014-10-30 ENCOUNTER — Ambulatory Visit (HOSPITAL_COMMUNITY): Payer: Worker's Compensation

## 2014-10-30 ENCOUNTER — Encounter (HOSPITAL_COMMUNITY): Payer: Self-pay

## 2014-10-30 DIAGNOSIS — M25611 Stiffness of right shoulder, not elsewhere classified: Secondary | ICD-10-CM

## 2014-10-30 DIAGNOSIS — M6289 Other specified disorders of muscle: Secondary | ICD-10-CM

## 2014-10-30 DIAGNOSIS — R29898 Other symptoms and signs involving the musculoskeletal system: Secondary | ICD-10-CM

## 2014-10-30 DIAGNOSIS — Z9889 Other specified postprocedural states: Secondary | ICD-10-CM | POA: Diagnosis not present

## 2014-10-30 DIAGNOSIS — M629 Disorder of muscle, unspecified: Secondary | ICD-10-CM

## 2014-10-30 NOTE — Therapy (Signed)
[place], [place], [address] Phone: [phone]   Fax:  [phone]  Occupational Therapy Treatment  Patient Details  Name: Deborah Baker MRN: [id number] Date of Birth: 07-18-1965 Referring Provider:  Marchia Bond, MD  Encounter Date: 10/30/2014      OT End of Session - 10/30/14 1255    Visit Number 10   Number of Visits 36   Date for OT Re-Evaluation 12/01/14  mini reassess 9/30   Authorization Type Worker's Comp   OT Start Time 1104   OT Stop Time 1145   OT Time Calculation (min) 41 min   Activity Tolerance Patient tolerated treatment well   Behavior During Therapy [facility] for tasks assessed/performed      Past Medical History  Diagnosis Date  . Hypertension   . Outbursts of anger   . Hyperlipemia     Past Surgical History  Procedure Laterality Date  . Rotator cuff repair      There were no vitals filed for this visit.  Visit Diagnosis:  Shoulder weakness  Tight fascia  Shoulder stiffness, right      Subjective Assessment - 10/30/14 1105    Subjective  S: It just feels tight today but not painful.   Special Tests FOTO score: 55/100 (45% impaired)   Currently in Pain? No/denies            [facility] OT Assessment - 10/30/14 1105    Assessment   Diagnosis Right rotator cuff repair   Precautions   Precautions Shoulder   Type of Shoulder Precautions 7-8 weeks post op (9/1-9/8): Wean from sling. PROM within tolerance level. Completed AAROM, scapular theraband exercises.   8-9 weeks post op (9/8-9/15): UBE, AROM, ER in sidelying, prone extension, row, horizontal abd. 9-14 weeks post op (9/15-10/20): initiate light stretching, rythmic stabilization, closed chain with wall and floor, plyometrics, cybex rows/press, general strengthening   AROM   Overall AROM Comments Assessed supine. ir/ER adducted   AROM Assessment Site Shoulder   Right/Left Shoulder Right   Right Shoulder Flexion 151  Degrees  previous: 130   Right Shoulder ABduction 101 Degrees  previous: 90   Right Shoulder Internal Rotation 90 Degrees  previous: 90   Right Shoulder External Rotation 62 Degrees  previous: 44   PROM   Overall PROM Comments Assessed supine. ir/ER adducted.   PROM Assessment Site Shoulder   Right/Left Shoulder Right   Right Shoulder Flexion 136 Degrees  previous: 133   Right Shoulder ABduction 110 Degrees  previous: 90   Right Shoulder Internal Rotation 90 Degrees  previous: 90   Right Shoulder External Rotation 65 Degrees  previous: 36   Strength   Overall Strength Comments Assessed seated. ir/ER adducted. Strength not assessed prior to today.    Strength Assessment Site Shoulder   Right/Left Shoulder Right   Right Shoulder Flexion 3-/5   Right Shoulder ABduction 3-/5   Right Shoulder Internal Rotation 3/5   Right Shoulder External Rotation 3-/5                  OT Treatments/Exercises (OP) - 10/30/14 1127    Exercises   Exercises Shoulder   Shoulder Exercises: Supine   Protraction PROM;AROM;10 reps   Horizontal ABduction PROM;AROM;10 reps   External Rotation PROM;AROM;10 reps   Internal Rotation PROM;AROM;10 reps   Flexion PROM;AROM;10 reps   ABduction PROM;AROM;10 reps   Shoulder Exercises: Standing   Protraction AROM;10 reps   Horizontal ABduction AROM;10 reps  External Rotation AROM;10 reps   Internal Rotation AROM;10 reps   Flexion AROM;10 reps   ABduction AROM;10 reps   Shoulder Exercises: ROM/Strengthening   Proximal Shoulder Strengthening, Supine 10X no rest breaks   Manual Therapy   Manual Therapy Myofascial release   Myofascial Release Myofascial release to right upper arm, deltoid, trapezius, and scapularis regions to decrease pain and fascial restrictions and increase joint range of motion.                   OT Short Term Goals - 10/30/14 1124    OT SHORT TERM GOAL #1   Title Patient will be educated and independent with  HEP.   Time 6   Period Weeks   Status On-going   OT SHORT TERM GOAL #2   Title Patient will increase PROM to [facility] to increase ability to complete dressing tasks.    Time 6   Period Weeks   Status Achieved   OT SHORT TERM GOAL #3   Title Patient will increase right shoulder strength to 3/5 to increase ability to hold onto light weight items.    Time 6   Period Weeks   Status On-going   OT SHORT TERM GOAL #4   Title Patient will decrease pain to 3/10 or less during daily tasks.   Time 6   Period Weeks   Status Achieved   OT SHORT TERM GOAL #5   Title Patient will decrease fascial restrictions to mod amount.    Time 6   Period Weeks   Status Achieved           OT Long Term Goals - 10/30/14 1126    OT LONG TERM GOAL #1   Title patient will return to highest level of independencen with all daily, leisure, and work related tasks.    Time 12   Period Weeks   Status On-going   OT LONG TERM GOAL #2   Title Patient will increase AROM to [facility] to increase ability to complete tasks above shoulder height.    Time 12   Period Weeks   Status On-going   OT LONG TERM GOAL #3   Title Patient will increase right shoulder strength to 4/5 to increase ability to return to work and perform required tasks.   Time 12   Period Weeks   Status On-going   OT LONG TERM GOAL #4   Title Patient will decrease pain level to 1/10 or less during daily tasks.   Time 12   Period Weeks   Status On-going   OT LONG TERM GOAL #5   Title Patient will decrease fascial restrictions to min amount.    Time 12   Period Weeks   Status On-going               Plan - 10/30/14 1256    Clinical Impression Statement A: Reassessment completed this date. patient has met 3/5 STGs and is making great progress during therapy towards goals. patient states that she is able to reach at shoulder height now although reaching above shoulder heisght remains difficulty. Progressed to A/ROM supine and standing this date.     Plan P: Add X to V arms and UBE bike.         Problem List There are no active problems to display for this patient.   Ailene Ravel, OTR/L,CBIS  [phone]  10/30/2014, 2:24 PM  [place] [address] [place], [place], [address]  Phone: [phone]   Fax:  [phone]

## 2014-11-02 ENCOUNTER — Ambulatory Visit (HOSPITAL_COMMUNITY): Payer: Worker's Compensation | Attending: Orthopedic Surgery

## 2014-11-02 ENCOUNTER — Encounter (HOSPITAL_COMMUNITY): Payer: Self-pay

## 2014-11-02 DIAGNOSIS — M25611 Stiffness of right shoulder, not elsewhere classified: Secondary | ICD-10-CM

## 2014-11-02 DIAGNOSIS — M629 Disorder of muscle, unspecified: Secondary | ICD-10-CM | POA: Insufficient documentation

## 2014-11-02 DIAGNOSIS — M25511 Pain in right shoulder: Secondary | ICD-10-CM | POA: Diagnosis present

## 2014-11-02 DIAGNOSIS — M6289 Other specified disorders of muscle: Secondary | ICD-10-CM

## 2014-11-02 DIAGNOSIS — R29898 Other symptoms and signs involving the musculoskeletal system: Secondary | ICD-10-CM

## 2014-11-02 NOTE — Therapy (Signed)
[facility] [address] [place], [place], [address] Phone: [phone]   Fax:  [phone]  Occupational Therapy Treatment  Patient Details  Name: Deborah Baker MRN: [id number] Date of Birth: [birth year] Referring Provider:  Benita Stabile, MD  Encounter Date: 11/02/2014      OT End of Session - 11/02/14 1619    Visit Number 11   Number of Visits 36   Date for OT Re-Evaluation 12/01/14   Authorization Type Worker's Comp   OT Start Time 1600   OT Stop Time 1645   OT Time Calculation (min) 45 min   Activity Tolerance Patient tolerated treatment well   Behavior During Therapy [facility] for tasks assessed/performed      Past Medical History  Diagnosis Date  . Hypertension   . Outbursts of anger   . Hyperlipemia     Past Surgical History  Procedure Laterality Date  . Rotator cuff repair      There were no vitals filed for this visit.  Visit Diagnosis:  Shoulder weakness  Tight fascia  Shoulder stiffness, right      Subjective Assessment - 11/02/14 1620    Subjective  S: I was so sore over the weekend. I guess it must have been from Friday.   Currently in Pain? No/denies            [facility] OT Assessment - 11/02/14 1620    Assessment   Diagnosis Right rotator cuff repair   Precautions   Precautions Shoulder   Type of Shoulder Precautions 7-8 weeks post op (9/1-9/8): Wean from sling. PROM within tolerance level. Completed AAROM, scapular theraband exercises.   8-9 weeks post op (9/8-9/15): UBE, AROM, ER in sidelying, prone extension, row, horizontal abd. 9-14 weeks post op (9/15-10/20): initiate light stretching, rythmic stabilization, closed chain with wall and floor, plyometrics, cybex rows/press, general strengthening                  OT Treatments/Exercises (OP) - 11/02/14 1621    Exercises   Exercises Shoulder   Shoulder Exercises: Supine   Protraction PROM;AROM;10 reps   Horizontal ABduction  PROM;AROM;10 reps   External Rotation PROM;AROM;10 reps   Internal Rotation PROM;AROM;10 reps   Flexion PROM;AROM;10 reps   ABduction PROM;AROM;10 reps   Shoulder Exercises: Standing   Extension Theraband;10 reps   Theraband Level (Shoulder Extension) Level 2 (Red)   Row Theraband;10 reps   Theraband Level (Shoulder Row) Level 2 (Red)   Retraction Theraband;10 reps   Theraband Level (Shoulder Retraction) Level 2 (Red)   Shoulder Exercises: ROM/Strengthening   UBE (Upper Arm Bike) Level 1 2' forward 2' reverse   Cues for maintain constant speed.   X to V Arms 5X   Proximal Shoulder Strengthening, Supine 10X no rest breaks   Proximal Shoulder Strengthening, Seated 10X with rest breaks   Manual Therapy   Manual Therapy Myofascial release;Muscle Energy Technique   Myofascial Release Myofascial release to right upper arm, deltoid, trapezius, and scapularis regions to decrease pain and fascial restrictions and increase joint range of motion.    Muscle Energy Technique Muscle energy technique to RUE to decrease muscle spasms and increase range of motion.                  OT Short Term Goals - 10/30/14 1124    OT SHORT TERM GOAL #1   Title Patient will be educated and independent with HEP.   Time 6   Period  Weeks   Status On-going   OT SHORT TERM GOAL #2   Title Patient will increase PROM to [facility] to increase ability to complete dressing tasks.    Time 6   Period Weeks   Status Achieved   OT SHORT TERM GOAL #3   Title Patient will increase right shoulder strength to 3/5 to increase ability to hold onto light weight items.    Time 6   Period Weeks   Status On-going   OT SHORT TERM GOAL #4   Title Patient will decrease pain to 3/10 or less during daily tasks.   Time 6   Period Weeks   Status Achieved   OT SHORT TERM GOAL #5   Title Patient will decrease fascial restrictions to mod amount.    Time 6   Period Weeks   Status Achieved           OT Long Term Goals -  10/30/14 1126    OT LONG TERM GOAL #1   Title patient will return to highest level of independencen with all daily, leisure, and work related tasks.    Time 12   Period Weeks   Status On-going   OT LONG TERM GOAL #2   Title Patient will increase AROM to [facility] to increase ability to complete tasks above shoulder height.    Time 12   Period Weeks   Status On-going   OT LONG TERM GOAL #3   Title Patient will increase right shoulder strength to 4/5 to increase ability to return to work and perform required tasks.   Time 12   Period Weeks   Status On-going   OT LONG TERM GOAL #4   Title Patient will decrease pain level to 1/10 or less during daily tasks.   Time 12   Period Weeks   Status On-going   OT LONG TERM GOAL #5   Title Patient will decrease fascial restrictions to min amount.    Time 12   Period Weeks   Status On-going               Plan - 11/02/14 1657    Clinical Impression Statement A: Added X to V arms, UBE bike and Scapular theraband exercises. patient needed slight verbal cues for form and proper technique. Slight pain with UBE reverse   Plan P: Update HEP for A/ROM and theraband exercises.         Problem List There are no active problems to display for this patient.   Limmie Patricia, OTR/L,CBIS  [phone]  11/02/2014, 5:02 PM  [facility] [address] [place], [place], [address] Phone: [phone]   Fax:  [phone]

## 2014-11-04 ENCOUNTER — Encounter (HOSPITAL_COMMUNITY): Payer: Self-pay

## 2014-11-04 ENCOUNTER — Ambulatory Visit (HOSPITAL_COMMUNITY): Payer: Worker's Compensation

## 2014-11-04 DIAGNOSIS — R29898 Other symptoms and signs involving the musculoskeletal system: Secondary | ICD-10-CM | POA: Diagnosis not present

## 2014-11-04 DIAGNOSIS — M629 Disorder of muscle, unspecified: Secondary | ICD-10-CM

## 2014-11-04 DIAGNOSIS — M25611 Stiffness of right shoulder, not elsewhere classified: Secondary | ICD-10-CM

## 2014-11-04 DIAGNOSIS — M6289 Other specified disorders of muscle: Secondary | ICD-10-CM

## 2014-11-04 NOTE — Patient Instructions (Signed)
1) Shoulder Protraction    Begin with elbows by your side, slowly "punch" straight out in front of you  Repeat _10-12__times, _1___set/day.     2) Shoulder Flexion  Supine:     Standing:         Begin with arms at your side with thumbs pointed up, slowly raise both arms up and forward towards overhead. Repeat_10-12__times, __1_set/day.       3) Horizontal abduction/adduction  Supine:   Standing:           Begin with arms straight out in front of you, bring out to the side in at "T" shape. Keep arms straight entire time. Repeat _10-12___times, __1__sets/day.                 4) Internal & External Rotation    *No band* -Stand with elbows at the side and elbows bent 90 degrees. Move your forearms away from your body, then bring back inward toward the body.  Repeat _10-12__times, _1__sets/day    5) Shoulder Abduction  Supine:     Standing:       Lying on your back begin with your arms flat on the table next to your side. Slowly move your arms out to the side so that they go overhead, in a jumping jack or snow angel movement. Repeat 10-12___times, _1__sets/day   (Home) Extension: Isometric / Bilateral Arm Retraction - Sitting   Facing anchor, hold hands and elbow at shoulder height, with elbow bent.  Pull arms back to squeeze shoulder blades together. Repeat 10-15 times.   (Home) Retraction: Row - Bilateral (Anchor)   Facing anchor, arms reaching forward, pull hands toward stomach, keeping elbows bent and at your sides and pinching shoulder blades together. Repeat 10-15 times.    (Clinic) Extension / Flexion (Assist)   Face anchor, pull arms back, keeping elbow straight, and squeze shoulder blades together. Repeat 10-15 times.

## 2014-11-04 NOTE — Therapy (Signed)
[facility] [address] [place], [place], [address] Phone: [phone]   Fax:  [phone]  Occupational Therapy Treatment  Patient Details  Name: Deborah Baker MRN: [id number] Date of Birth: [birth year] Referring Provider:  Benita Stabile, MD  Encounter Date: 11/04/2014      OT End of Session - 11/04/14 1655    Visit Number 12   Number of Visits 36   Date for OT Re-Evaluation 12/01/14   Authorization Type Worker's Comp   OT Start Time 1613   OT Stop Time 1645   OT Time Calculation (min) 32 min   Activity Tolerance Patient tolerated treatment well   Behavior During Therapy [facility] for tasks assessed/performed      Past Medical History  Diagnosis Date  . Hypertension   . Outbursts of anger   . Hyperlipemia     Past Surgical History  Procedure Laterality Date  . Rotator cuff repair      There were no vitals filed for this visit.  Visit Diagnosis:  Shoulder weakness  Tight fascia  Shoulder stiffness, right      Subjective Assessment - 11/04/14 1622    Subjective  S: My shoulder feels pretty good today.    Currently in Pain? No/denies            [facility] OT Assessment - 11/04/14 1623    Assessment   Diagnosis Right rotator cuff repair   Precautions   Precautions Shoulder   Type of Shoulder Precautions 7-8 weeks post op (9/1-9/8): Wean from sling. PROM within tolerance level. Completed AAROM, scapular theraband exercises.   8-9 weeks post op (9/8-9/15): UBE, AROM, ER in sidelying, prone extension, row, horizontal abd. 9-14 weeks post op (9/15-10/20): initiate light stretching, rythmic stabilization, closed chain with wall and floor, plyometrics, cybex rows/press, general strengthening                  OT Treatments/Exercises (OP) - 11/04/14 1623    Exercises   Exercises Shoulder   Shoulder Exercises: Supine   Protraction PROM;AROM;10 reps   Horizontal ABduction PROM;AROM;10 reps   External Rotation  PROM;AROM;10 reps   Internal Rotation PROM;AROM;10 reps   Flexion PROM;AROM;10 reps   ABduction PROM;AROM;10 reps   Shoulder Exercises: Standing   Protraction AROM;10 reps   Horizontal ABduction AROM;10 reps   External Rotation AROM;10 reps   Internal Rotation AROM;10 reps   Flexion AROM;10 reps   ABduction AROM;10 reps   Extension Theraband;10 reps   Theraband Level (Shoulder Extension) Level 2 (Red)   Row Theraband;10 reps   Theraband Level (Shoulder Row) Level 2 (Red)   Retraction Theraband;10 reps   Theraband Level (Shoulder Retraction) Level 2 (Red)   Shoulder Exercises: ROM/Strengthening   UBE (Upper Arm Bike) Level 1 2' forward 2' reverse   Cueing to maintain speed   Over Head Lace 1'30"   X to V Arms 10X   Proximal Shoulder Strengthening, Supine 10X no rest breaks                OT Education - 11/04/14 1655    Education provided Yes   Education Details Scapular theraband and A/ROM exercises   Person(s) Educated Patient   Methods Explanation;Demonstration;Handout   Comprehension Returned demonstration;Verbalized understanding          OT Short Term Goals - 10/30/14 1124    OT SHORT TERM GOAL #1   Title Patient will be educated and independent with HEP.   Time 6  Period Weeks   Status On-going   OT SHORT TERM GOAL #2   Title Patient will increase PROM to [facility] to increase ability to complete dressing tasks.    Time 6   Period Weeks   Status Achieved   OT SHORT TERM GOAL #3   Title Patient will increase right shoulder strength to 3/5 to increase ability to hold onto light weight items.    Time 6   Period Weeks   Status On-going   OT SHORT TERM GOAL #4   Title Patient will decrease pain to 3/10 or less during daily tasks.   Time 6   Period Weeks   Status Achieved   OT SHORT TERM GOAL #5   Title Patient will decrease fascial restrictions to mod amount.    Time 6   Period Weeks   Status Achieved           OT Long Term Goals - 10/30/14 1126     OT LONG TERM GOAL #1   Title patient will return to highest level of independencen with all daily, leisure, and work related tasks.    Time 12   Period Weeks   Status On-going   OT LONG TERM GOAL #2   Title Patient will increase AROM to [facility] to increase ability to complete tasks above shoulder height.    Time 12   Period Weeks   Status On-going   OT LONG TERM GOAL #3   Title Patient will increase right shoulder strength to 4/5 to increase ability to return to work and perform required tasks.   Time 12   Period Weeks   Status On-going   OT LONG TERM GOAL #4   Title Patient will decrease pain level to 1/10 or less during daily tasks.   Time 12   Period Weeks   Status On-going   OT LONG TERM GOAL #5   Title Patient will decrease fascial restrictions to min amount.    Time 12   Period Weeks   Status On-going               Plan - 11/04/14 1655    Clinical Impression Statement A: Pt arrived late to session. Updated HEP for A/ROM and theraband exercises. Patient demonstrated with good form.    Plan P: Increase repetitions for supine and standing A/ROM.        Problem List There are no active problems to display for this patient.   Limmie Patricia, OTR/L,CBIS  [phone]  11/04/2014, 4:57 PM  [facility] [address] [place], [place], [address] Phone: [phone]   Fax:  [phone]

## 2014-11-09 ENCOUNTER — Ambulatory Visit (HOSPITAL_COMMUNITY): Payer: Worker's Compensation | Admitting: Specialist

## 2014-11-09 DIAGNOSIS — R29898 Other symptoms and signs involving the musculoskeletal system: Secondary | ICD-10-CM

## 2014-11-09 DIAGNOSIS — M6289 Other specified disorders of muscle: Secondary | ICD-10-CM

## 2014-11-09 DIAGNOSIS — M629 Disorder of muscle, unspecified: Secondary | ICD-10-CM

## 2014-11-09 DIAGNOSIS — M25611 Stiffness of right shoulder, not elsewhere classified: Secondary | ICD-10-CM

## 2014-11-09 DIAGNOSIS — M25511 Pain in right shoulder: Secondary | ICD-10-CM

## 2014-11-09 NOTE — Therapy (Signed)
[facility] [address] [place], [place], [address] Phone: [phone]   Fax:  [phone]  Occupational Therapy Treatment  Patient Details  Name: Deborah Baker MRN: [id number] Date of Birth: 06-29-1965 Referring Provider:  Teryl Lucy, MD  Encounter Date: 11/09/2014      OT End of Session - 11/09/14 1613    Visit Number 13   Number of Visits 36   Date for OT Re-Evaluation 12/01/14   Authorization Type Worker's Comp   OT Start Time 1435   OT Stop Time 1520   OT Time Calculation (min) 45 min   Activity Tolerance Patient tolerated treatment well   Behavior During Therapy [facility] for tasks assessed/performed      Past Medical History  Diagnosis Date  . Hypertension   . Outbursts of anger   . Hyperlipemia     Past Surgical History  Procedure Laterality Date  . Rotator cuff repair      There were no vitals filed for this visit.  Visit Diagnosis:  Shoulder weakness  Tight fascia  Shoulder stiffness, right  Right shoulder pain      Subjective Assessment - 11/09/14 1503    Subjective  S:  I just cant reach out to the side, I dont know why that is so difficult   Limitations 7-8 weeks post op (9/1-9/8): Wean from sling. PROM within tolerance level. Completed AAROM, scapular theraband exercises.   8-9 weeks post op (9/8-9/15): UBE, AROM, ER in sidelying, prone extension, row, horizontal abd. 9-14 weeks post op (9/15-10/20): initiate light stretching, rythmic stabilization, closed chain with wall and floor, plyometrics, cybex rows/press, general strengthening   Currently in Pain? No/denies   Pain Score 0-No pain            [facility] OT Assessment - 11/09/14 0001    Assessment   Diagnosis Right rotator cuff repair   Precautions   Precautions Shoulder   Type of Shoulder Precautions 7-8 weeks post op (9/1-9/8): Wean from sling. PROM within tolerance level. Completed AAROM, scapular theraband exercises.   8-9 weeks post op  (9/8-9/15): UBE, AROM, ER in sidelying, prone extension, row, horizontal abd. 9-14 weeks post op (9/15-10/20): initiate light stretching, rythmic stabilization, closed chain with wall and floor, plyometrics, cybex rows/press, general strengthening   Restrictions   Weight Bearing Restrictions Yes                  OT Treatments/Exercises (OP) - 11/09/14 0001    Exercises   Exercises Shoulder   Shoulder Exercises: Supine   Protraction PROM;10 reps;AROM;12 reps   Horizontal ABduction PROM;10 reps;AROM;12 reps   External Rotation PROM;10 reps;AROM;12 reps   Internal Rotation PROM;10 reps;AROM;12 reps   Flexion PROM;10 reps;AROM;12 reps   ABduction PROM;10 reps;AROM;12 reps   Shoulder Exercises: Standing   Protraction AROM;12 reps   Horizontal ABduction AROM;12 reps   External Rotation AROM;12 reps   Internal Rotation AROM;12 reps   Flexion AROM;12 reps   ABduction AROM;12 reps   Extension Theraband;12 reps   Theraband Level (Shoulder Extension) Level 2 (Red)   Row Theraband;12 reps   Theraband Level (Shoulder Row) Level 2 (Red)   Retraction Theraband;12 reps   Theraband Level (Shoulder Retraction) Level 2 (Red)   Shoulder Exercises: ROM/Strengthening   Proximal Shoulder Strengthening, Supine 10X no rest breaks   Other ROM/Strengthening Exercises slidingcloth up pole positioned in corner, turning to left as she reaches end range X 15 repetiitons with excellent results   Other  ROM/Strengthening Exercises arm swings with body twist low and slow X 10 low and moderate X 10, high and slow X 10   Manual Therapy   Manual Therapy Myofascial release   Myofascial Release Myofascial release to right upper arm, deltoid, trapezius, and scapularis regions to decrease pain and fascial restrictions and increase joint range of motion. Myofascial release to cervical region and sternocleidomastoid region.  Scar massage/release to lateral healed surgical incision this date.                   OT Education - 11/09/14 1613    Education Details recommended completing arm swings at home   Person(s) Educated Patient   Methods Explanation;Demonstration   Comprehension Verbalized understanding;Returned demonstration          OT Short Term Goals - 10/30/14 1124    OT SHORT TERM GOAL #1   Title Patient will be educated and independent with HEP.   Time 6   Period Weeks   Status On-going   OT SHORT TERM GOAL #2   Title Patient will increase PROM to [facility] to increase ability to complete dressing tasks.    Time 6   Period Weeks   Status Achieved   OT SHORT TERM GOAL #3   Title Patient will increase right shoulder strength to 3/5 to increase ability to hold onto light weight items.    Time 6   Period Weeks   Status On-going   OT SHORT TERM GOAL #4   Title Patient will decrease pain to 3/10 or less during daily tasks.   Time 6   Period Weeks   Status Achieved   OT SHORT TERM GOAL #5   Title Patient will decrease fascial restrictions to mod amount.    Time 6   Period Weeks   Status Achieved           OT Long Term Goals - 10/30/14 1126    OT LONG TERM GOAL #1   Title patient will return to highest level of independencen with all daily, leisure, and work related tasks.    Time 12   Period Weeks   Status On-going   OT LONG TERM GOAL #2   Title Patient will increase AROM to [facility] to increase ability to complete tasks above shoulder height.    Time 12   Period Weeks   Status On-going   OT LONG TERM GOAL #3   Title Patient will increase right shoulder strength to 4/5 to increase ability to return to work and perform required tasks.   Time 12   Period Weeks   Status On-going   OT LONG TERM GOAL #4   Title Patient will decrease pain level to 1/10 or less during daily tasks.   Time 12   Period Weeks   Status On-going   OT LONG TERM GOAL #5   Title Patient will decrease fascial restrictions to min amount.    Time 12   Period Weeks   Status On-going                Plan - 11/09/14 1613    Clinical Impression Statement A:  Patient had good vasomotor response from scar release technique completed this date.  Added sliding hand up PVC pipe positioned at 45 degrees with twist to left at end range - patient had notable increase in A/ROM flexion after completing this exercise   Plan P:  Continue PVC pipe exercise, arm swing exercise, continue scar release and MFR  to cervical region.         Problem List There are no active problems to display for this patient.   Shirlean Mylar, OTR/L [phone]  11/09/2014, 4:17 PM  [facility] [address] [place], [place], [address] Phone: [phone]   Fax:  [phone]

## 2014-11-11 ENCOUNTER — Ambulatory Visit (HOSPITAL_COMMUNITY): Payer: Worker's Compensation | Admitting: Occupational Therapy

## 2014-11-11 ENCOUNTER — Encounter (HOSPITAL_COMMUNITY): Payer: Self-pay | Admitting: Occupational Therapy

## 2014-11-11 DIAGNOSIS — R29898 Other symptoms and signs involving the musculoskeletal system: Secondary | ICD-10-CM

## 2014-11-11 DIAGNOSIS — M629 Disorder of muscle, unspecified: Secondary | ICD-10-CM

## 2014-11-11 DIAGNOSIS — M25611 Stiffness of right shoulder, not elsewhere classified: Secondary | ICD-10-CM

## 2014-11-11 DIAGNOSIS — M25511 Pain in right shoulder: Secondary | ICD-10-CM

## 2014-11-11 DIAGNOSIS — M6289 Other specified disorders of muscle: Secondary | ICD-10-CM

## 2014-11-11 NOTE — Therapy (Signed)
[facility] [address] [place], [place], [address] Phone: [phone]   Fax:  [phone]  Occupational Therapy Treatment  Patient Details  Name: Deborah Baker MRN: [id number] Date of Birth: Oct 09, 1965 Referring Provider:  Teryl LucyLandau, Joshua, MD  Encounter Date: 11/11/2014      OT End of Session - 11/11/14 1614    Visit Number 14   Number of Visits 36   Date for OT Re-Evaluation 12/01/14   Authorization Type Worker's Comp   OT Start Time 1433   OT Stop Time 1516   OT Time Calculation (min) 43 min   Activity Tolerance Patient tolerated treatment well   Behavior During Therapy [facility] for tasks assessed/performed      Past Medical History  Diagnosis Date  . Hypertension   . Outbursts of anger   . Hyperlipemia     Past Surgical History  Procedure Laterality Date  . Rotator cuff repair      There were no vitals filed for this visit.  Visit Diagnosis:  Shoulder weakness  Tight fascia  Shoulder stiffness, right  Right shoulder pain      Subjective Assessment - 11/11/14 1433    Subjective  S: That pole exercise last time was a good workout.    Currently in Pain? No/denies            [facility] - 11/11/14 1613    Assessment   Diagnosis Right rotator cuff repair   Precautions   Precautions Shoulder   Type of Shoulder Precautions 7-8 weeks post op (9/1-9/8): Wean from sling. PROM within tolerance level. Completed AAROM, scapular theraband exercises.   8-9 weeks post op (9/8-9/15): UBE, AROM, ER in sidelying, prone extension, row, horizontal abd. 9-14 weeks post op (9/15-10/20): initiate light stretching, rythmic stabilization, closed chain with wall and floor, plyometrics, cybex rows/press, general strengthening                  OT Treatments/Exercises (OP) - 11/11/14 1435    Exercises   Exercises Shoulder   Shoulder Exercises: Supine   Protraction PROM;10 reps;AROM;12 reps   Horizontal  ABduction PROM;10 reps;AROM;12 reps   External Rotation PROM;10 reps;AROM;12 reps   Internal Rotation PROM;10 reps;AROM;12 reps   Flexion PROM;10 reps;AROM;12 reps   ABduction PROM;10 reps;AROM;12 reps   Shoulder Exercises: Standing   Protraction AROM;12 reps   Horizontal ABduction AROM;12 reps   External Rotation AROM;12 reps   Internal Rotation AROM;12 reps   Flexion AROM;12 reps   ABduction AROM;12 reps   Extension Theraband;12 reps   Theraband Level (Shoulder Extension) Level 2 (Red)   Row Theraband;12 reps   Theraband Level (Shoulder Row) Level 2 (Red)   Retraction Theraband;12 reps   Theraband Level (Shoulder Retraction) Level 2 (Red)   Shoulder Exercises: ROM/Strengthening   Over Head Lace 1'30"   X to V Arms 10X   Proximal Shoulder Strengthening, Supine 12X no rest breaks   Other ROM/Strengthening Exercises slidingcloth up pole positioned in corner, turning to left as she reaches end range X 15 repetiitons with excellent results   Other ROM/Strengthening Exercises arm swings with body twist low and slow X 10 low and moderate X 10, high and slow X 10   Manual Therapy   Manual Therapy Myofascial release   Myofascial Release Myofascial release to right upper arm, deltoid, trapezius, and scapularis regions to decrease pain and fascial restrictions and increase joint range of motion. Myofascial release to cervical region and sternocleidomastoid region.  Scar massage/release to lateral healed surgical incision this date.                    OT Short Term Goals - 10/30/14 1124    OT SHORT TERM GOAL #1   Title Patient will be educated and independent with HEP.   Time 6   Period Weeks   Status On-going   OT SHORT TERM GOAL #2   Title Patient will increase PROM to [facility] to increase ability to complete dressing tasks.    Time 6   Period Weeks   Status Achieved   OT SHORT TERM GOAL #3   Title Patient will increase right shoulder strength to 3/5 to increase ability to hold  onto light weight items.    Time 6   Period Weeks   Status On-going   OT SHORT TERM GOAL #4   Title Patient will decrease pain to 3/10 or less during daily tasks.   Time 6   Period Weeks   Status Achieved   OT SHORT TERM GOAL #5   Title Patient will decrease fascial restrictions to mod amount.    Time 6   Period Weeks   Status Achieved           OT Long Term Goals - 10/30/14 1126    OT LONG TERM GOAL #1   Title patient will return to highest level of independencen with all daily, leisure, and work related tasks.    Time 12   Period Weeks   Status On-going   OT LONG TERM GOAL #2   Title Patient will increase AROM to [employer] to increase ability to complete tasks above shoulder height.    Time 12   Period Weeks   Status On-going   OT LONG TERM GOAL #3   Title Patient will increase right shoulder strength to 4/5 to increase ability to return to work and perform required tasks.   Time 12   Period Weeks   Status On-going   OT LONG TERM GOAL #4   Title Patient will decrease pain level to 1/10 or less during daily tasks.   Time 12   Period Weeks   Status On-going   OT LONG TERM GOAL #5   Title Patient will decrease fascial restrictions to min amount.    Time 12   Period Weeks   Status On-going               Plan - 11/11/14 1614    Clinical Impression Statement A: Continued PVC pipe exercise, pt reports her arm feels better after this exercise. Pt with continued tenderness in right anterior and medial deltoid region. Pt completed all exercises with good form this session.    Plan P: Add w arms, rhythmic stabilization in supine. Cont UBE if time allows.         Problem List There are no active problems to display for this patient.   Ezra Sites, OTR/L  [phone]  11/11/2014, 4:17 PM  [facility] [address] [place], [place], [address] Phone: [phone]   Fax:  [phone]

## 2014-11-17 ENCOUNTER — Ambulatory Visit (HOSPITAL_COMMUNITY): Payer: Worker's Compensation

## 2014-11-17 DIAGNOSIS — R29898 Other symptoms and signs involving the musculoskeletal system: Secondary | ICD-10-CM | POA: Diagnosis not present

## 2014-11-17 DIAGNOSIS — M25611 Stiffness of right shoulder, not elsewhere classified: Secondary | ICD-10-CM

## 2014-11-17 DIAGNOSIS — M6289 Other specified disorders of muscle: Secondary | ICD-10-CM

## 2014-11-17 DIAGNOSIS — M629 Disorder of muscle, unspecified: Secondary | ICD-10-CM

## 2014-11-17 NOTE — Therapy (Signed)
[facility] [address] [place], [place], [address] Phone: [phone]   Fax:  [phone]  Occupational Therapy Treatment  Patient Details  Name: Deborah Baker MRN: [id number] Date of Birth: 1965-07-18 Referring Provider: Teryl Lucy, MD  Encounter Date: 11/17/2014      OT End of Session - 11/17/14 1756    Visit Number 15   Number of Visits 36   Date for OT Re-Evaluation 12/01/14   Authorization Type Worker's Comp   OT Start Time 1650   OT Stop Time 1730   OT Time Calculation (min) 40 min   Activity Tolerance Patient tolerated treatment well   Behavior During Therapy [facility] for tasks assessed/performed      Past Medical History  Diagnosis Date  . Hypertension   . Outbursts of anger   . Hyperlipemia     Past Surgical History  Procedure Laterality Date  . Rotator cuff repair      There were no vitals filed for this visit.  Visit Diagnosis:  Shoulder weakness  Tight fascia  Shoulder stiffness, right      Subjective Assessment - 11/17/14 1755    Subjective  S: I see the doctor on the 20th for my shoulder.    Currently in Pain? No/denies            [facility] OT Assessment - 11/17/14 1652    Assessment   Diagnosis Right rotator cuff repair   Referring Provider Teryl Lucy, MD   Precautions   Precautions Shoulder   Type of Shoulder Precautions 7-8 weeks post op (9/1-9/8): Wean from sling. PROM within tolerance level. Completed AAROM, scapular theraband exercises.   8-9 weeks post op (9/8-9/15): UBE, AROM, ER in sidelying, prone extension, row, horizontal abd. 9-14 weeks post op (9/15-10/20): initiate light stretching, rythmic stabilization, closed chain with wall and floor, plyometrics, cybex rows/press, general strengthening   AROM   Overall AROM Comments Assessed supine then seated. ir/ER adducted   AROM Assessment Site Shoulder   Right/Left Shoulder Right   Right Shoulder Flexion --  161 (supine)/  151(standing) Previous: 151 (supine)   Right Shoulder ABduction --  180 (supine)/136 (standing)  previous: 101 (supine)   Right Shoulder Internal Rotation 90 Degrees  previous: 90   Right Shoulder External Rotation --  70 (supine) / 85 (standing) Previous: 62 (supine)   Strength   Overall Strength Comments Assessed seated. ir/ER adducted.    Strength Assessment Site Shoulder   Right/Left Shoulder Right   Right Shoulder Flexion 4/5  Previous: 3-/5   Right Shoulder ABduction 3+/5  Previous: 3-/5   Right Shoulder Internal Rotation 3+/5  Previous: 3/5   Right Shoulder External Rotation 5/5  previous: 3-/5                  OT Treatments/Exercises (OP) - 11/17/14 1717    Exercises   Exercises Shoulder   Shoulder Exercises: Supine   Protraction PROM;10 reps;Strengthening;12 reps   Protraction Weight (lbs) 1   Horizontal ABduction PROM;10 reps;Strengthening;12 reps   Horizontal ABduction Weight (lbs) 1   External Rotation PROM;10 reps;Strengthening;12 reps   External Rotation Weight (lbs) 1   Internal Rotation PROM;10 reps;Strengthening;12 reps   Internal Rotation Weight (lbs) 1   Flexion PROM;10 reps;Strengthening;12 reps   Shoulder Flexion Weight (lbs) 1   ABduction PROM;10 reps;Strengthening;12 reps   Shoulder ABduction Weight (lbs) 1   Shoulder Exercises: Standing   Protraction Strengthening;12 reps   Protraction Weight (lbs) 1  Horizontal ABduction Strengthening;12 reps   Horizontal ABduction Weight (lbs) 1   External Rotation Strengthening;12 reps   External Rotation Weight (lbs) 1   Internal Rotation Strengthening;12 reps   Internal Rotation Weight (lbs) 1   Flexion Strengthening;12 reps   Shoulder Flexion Weight (lbs) 1   ABduction Strengthening;12 reps   Shoulder ABduction Weight (lbs) 1   Shoulder Exercises: ROM/Strengthening   X to V Arms 10X with 1#   Proximal Shoulder Strengthening, Supine 12X with 1# no rest breaks   Proximal Shoulder  Strengthening, Seated 12X with 1# no rest breaks   Manual Therapy   Manual Therapy Myofascial release   Myofascial Release Myofascial release to right upper arm, deltoid, trapezius, and scapularis regions to decrease pain and fascial restrictions and increase joint range of motion. Myofascial release to cervical region and sternocleidomastoid region.  Scar massage/release to lateral healed surgical incision this date.                    OT Short Term Goals - 10/30/14 1124    OT SHORT TERM GOAL #1   Title Patient will be educated and independent with HEP.   Time 6   Period Weeks   Status On-going   OT SHORT TERM GOAL #2   Title Patient will increase [facility] to [facility] to increase ability to complete dressing tasks.    Time 6   Period Weeks   Status Achieved   OT SHORT TERM GOAL #3   Title Patient will increase right shoulder strength to 3/5 to increase ability to hold onto light weight items.    Time 6   Period Weeks   Status On-going   OT SHORT TERM GOAL #4   Title Patient will decrease pain to 3/10 or less during daily tasks.   Time 6   Period Weeks   Status Achieved   OT SHORT TERM GOAL #5   Title Patient will decrease fascial restrictions to mod amount.    Time 6   Period Weeks   Status Achieved           OT Long Term Goals - 10/30/14 1126    OT LONG TERM GOAL #1   Title patient will return to highest level of independencen with all daily, leisure, and work related tasks.    Time 12   Period Weeks   Status On-going   OT LONG TERM GOAL #2   Title Patient will [facility] to increase ability to complete tasks above shoulder height.    Time 12   Period Weeks   Status On-going   OT LONG TERM GOAL #3   Title Patient will increase right shoulder strength to 4/5 to increase ability to return to work and perform required tasks.   Time 12   Period Weeks   Status On-going   OT LONG TERM GOAL #4   Title Patient will decrease pain level to 1/10 or less  during daily tasks.   Time 12   Period Weeks   Status On-going   OT LONG TERM GOAL #5   Title Patient will decrease fascial restrictions to min amount.    Time 12   Period Weeks   Status On-going               Plan - 11/17/14 1756    Clinical Impression Statement A: Unable to add W arms or rhythmic stabilization exercises due to time constraint. Added 1# hand weight supine and standing. patient  was able to complete with only initial VC for form and technique. Measurements and MMT completed for MD appt on 11/19/14.    Plan P: Add W arms and rthymic stabilization in supine. Cont with PVC piping exercise.        Problem List There are no active problems to display for this patient.   Limmie Patricia, OTR/L,CBIS  [phone]  11/17/2014, 5:59 PM  [place] [facility] [address] [place], [place], [address] Phone: [phone]   Fax:  [phone]  Name: Deborah Baker MRN: [id number] Date of Birth: 08-26-1965

## 2014-11-19 ENCOUNTER — Encounter (HOSPITAL_COMMUNITY): Payer: Self-pay

## 2014-11-20 ENCOUNTER — Ambulatory Visit (HOSPITAL_COMMUNITY): Payer: Worker's Compensation

## 2014-11-20 ENCOUNTER — Telehealth (HOSPITAL_COMMUNITY): Payer: Self-pay

## 2014-11-20 NOTE — Telephone Encounter (Signed)
She has to take her mother to see MD and can not come in for her OT today, Deborah Baker will plan to see Deborah Baker on Tuesday

## 2014-11-24 ENCOUNTER — Ambulatory Visit (HOSPITAL_COMMUNITY): Payer: Worker's Compensation

## 2014-11-24 ENCOUNTER — Encounter (HOSPITAL_COMMUNITY): Payer: Self-pay

## 2014-11-24 DIAGNOSIS — M6289 Other specified disorders of muscle: Secondary | ICD-10-CM

## 2014-11-24 DIAGNOSIS — M629 Disorder of muscle, unspecified: Secondary | ICD-10-CM

## 2014-11-24 DIAGNOSIS — R29898 Other symptoms and signs involving the musculoskeletal system: Secondary | ICD-10-CM | POA: Diagnosis not present

## 2014-11-24 DIAGNOSIS — M25611 Stiffness of right shoulder, not elsewhere classified: Secondary | ICD-10-CM

## 2014-11-24 NOTE — Patient Instructions (Signed)
Flexion    Stand with hands clasped over head. Extend arms upward as far as possible. Hold __10__ seconds. Repeat __3__ times. Do __2-3__ sessions per day.  Closed Chain: Shoulder Flexion / Extension - on Wall    Hands on wall, step backward. Return. Stepping causes shoulder flexion and extension Do _10__ second holds __3_ times.  http://ss.exer.us/265   Copyright  VHI. All rights reserved.

## 2014-11-24 NOTE — Therapy (Signed)
[facility] [address] [place], [place], [address] Phone: [phone]   Fax:  [phone]  Occupational Therapy Treatment  Patient Details  Name: Deborah Baker MRN: [id number] Date of Birth: 1966/01/23 Referring Provider: Teryl LucyJoshua Landau, MD  Encounter Date: 11/24/2014      OT End of Session - 11/24/14 1614    Visit Number 16   Number of Visits 36   Date for OT Re-Evaluation 12/01/14   Authorization Type Worker's Comp   OT Start Time 1430   OT Stop Time 1515   OT Time Calculation (min) 45 min   Activity Tolerance Patient tolerated treatment well   Behavior During Therapy [facility] for tasks assessed/performed      Past Medical History  Diagnosis Date  . Hypertension   . Outbursts of anger   . Hyperlipemia     Past Surgical History  Procedure Laterality Date  . Rotator cuff repair      There were no vitals filed for this visit.  Visit Diagnosis:  Shoulder weakness  Tight fascia  Shoulder stiffness, right      Subjective Assessment - 11/24/14 1501    Subjective  S: The doctor said to keep doing therapy. I can only drive at work but I can't do any lifting.   Currently in Pain? No/denies            [facility] - 11/24/14 1502    Assessment   Diagnosis Right rotator cuff repair   Precautions   Precautions Shoulder   Type of Shoulder Precautions 7-8 weeks post op (9/1-9/8): Wean from sling. PROM within tolerance level. Completed AAROM, scapular theraband exercises.   8-9 weeks post op (9/8-9/15): UBE, AROM, ER in sidelying, prone extension, row, horizontal abd. 9-14 weeks post op (9/15-10/20): initiate light stretching, rythmic stabilization, closed chain with wall and floor, plyometrics, cybex rows/press, general strengthening                  OT Treatments/Exercises (OP) - 11/24/14 1502    Exercises   Exercises Shoulder   Shoulder Exercises: Supine   Protraction PROM;5 reps;Strengthening;20  reps   Protraction Weight (lbs) 1   Horizontal ABduction PROM;5 reps;Strengthening;20 reps   Horizontal ABduction Weight (lbs) 1   External Rotation PROM;5 reps;Strengthening;20 reps   External Rotation Weight (lbs) 1   Internal Rotation PROM;5 reps;Strengthening;20 reps   Internal Rotation Weight (lbs) 1   Flexion PROM;5 reps;Strengthening;20 reps   Shoulder Flexion Weight (lbs) 1   ABduction PROM;5 reps;Strengthening;20 reps   Shoulder ABduction Weight (lbs) 1   Shoulder Exercises: Standing   Protraction Strengthening;20 reps   Protraction Weight (lbs) 1   Horizontal ABduction Strengthening;20 reps   Horizontal ABduction Weight (lbs) 1   External Rotation Strengthening;20 reps   External Rotation Weight (lbs) 1   Internal Rotation Strengthening;20 reps   Internal Rotation Weight (lbs) 1   Flexion Strengthening;20 reps   Shoulder Flexion Weight (lbs) 1   ABduction Strengthening;20 reps   Shoulder ABduction Weight (lbs) 1   Shoulder Exercises: ROM/Strengthening   "W" Arms 10X   Shoulder Exercises: Stretch   Wall Stretch - Flexion 3 reps;10 seconds   Other Shoulder Stretches [place] dancer shoulder flexion stretch; 10 second hold; 3 times   Manual Therapy   Manual Therapy Myofascial release;Muscle Energy Technique   Myofascial Release Myofascial release to right upper arm, deltoid, trapezius, and scapularis regions to decrease pain and fascial restrictions and increase joint range of motion. Myofascial  release to cervical region and sternocleidomastoid region.  Scar massage/release to lateral healed surgical incision this date.     Muscle Energy Technique Muscle energy technique to RUE to decrease muscle spasms and increase range of motion.                OT Education - 11/24/14 1614    Education Details Shoulder flexion stretches   Person(s) Educated Patient   Methods Demonstration;Handout;Explanation   Comprehension Verbalized understanding;Returned demonstration           OT Short Term Goals - 11/24/14 1616    OT SHORT TERM GOAL #1   Title Patient will be educated and independent with HEP.   Time 6   Period Weeks   Status On-going   OT SHORT TERM GOAL #2   Title Patient will increase PROM to [facility] to increase ability to complete dressing tasks.    Time 6   Period Weeks   OT SHORT TERM GOAL #3   Title Patient will increase right shoulder strength to 3/5 to increase ability to hold onto light weight items.    Time 6   Period Weeks   Status On-going   OT SHORT TERM GOAL #4   Title Patient will decrease pain to 3/10 or less during daily tasks.   Time 6   Period Weeks   OT SHORT TERM GOAL #5   Title Patient will decrease fascial restrictions to mod amount.    Time 6   Period Weeks           OT Long Term Goals - 10/30/14 1126    OT LONG TERM GOAL #1   Title patient will return to highest level of independencen with all daily, leisure, and work related tasks.    Time 12   Period Weeks   Status On-going   OT LONG TERM GOAL #2   Title Patient will increase AROM to [facility] to increase ability to complete tasks above shoulder height.    Time 12   Period Weeks   Status On-going   OT LONG TERM GOAL #3   Title Patient will increase right shoulder strength to 4/5 to increase ability to return to work and perform required tasks.   Time 12   Period Weeks   Status On-going   OT LONG TERM GOAL #4   Title Patient will decrease pain level to 1/10 or less during daily tasks.   Time 12   Period Weeks   Status On-going   OT LONG TERM GOAL #5   Title Patient will decrease fascial restrictions to min amount.    Time 12   Period Weeks   Status On-going               Plan - 11/24/14 1615    Clinical Impression Statement A: Added W arm and rthymic stabilization exercises in supine. Pt completed W arms with only initial need for cueing for form form and technique.    Plan P: Cont with PVC piping. Increase to 2# weight supine if able to  tolerate.         Problem List There are no active problems to display for this patient.   Limmie Patricia, OTR/L,CBIS  [phone]  11/24/2014, 4:17 PM  [facility] [address] [place], [place], [address] Phone: [phone]   Fax:  [phone]  Name: Deborah Baker MRN: [id number] Date of Birth: 12/20/1965

## 2014-11-26 ENCOUNTER — Ambulatory Visit (HOSPITAL_COMMUNITY): Payer: Worker's Compensation

## 2014-11-26 ENCOUNTER — Encounter (HOSPITAL_COMMUNITY): Payer: Self-pay

## 2014-11-26 DIAGNOSIS — M629 Disorder of muscle, unspecified: Secondary | ICD-10-CM

## 2014-11-26 DIAGNOSIS — M25611 Stiffness of right shoulder, not elsewhere classified: Secondary | ICD-10-CM

## 2014-11-26 DIAGNOSIS — M25511 Pain in right shoulder: Secondary | ICD-10-CM

## 2014-11-26 DIAGNOSIS — R29898 Other symptoms and signs involving the musculoskeletal system: Secondary | ICD-10-CM | POA: Diagnosis not present

## 2014-11-26 DIAGNOSIS — M6289 Other specified disorders of muscle: Secondary | ICD-10-CM

## 2014-11-26 NOTE — Therapy (Signed)
[facility] [address] [place], [place], [address] Phone: [phone]   Fax:  [phone]  Occupational Therapy Treatment  Patient Details  Name: Deborah Baker MRN: [id number] Date of Birth: [birth year] Referring Provider: Teryl Lucy, MD  Encounter Date: 11/26/2014      OT End of Session - 11/26/14 1519    Visit Number 17   Number of Visits 36   Date for OT Re-Evaluation 12/01/14   Authorization Type Worker's Comp   OT Start Time 1430   OT Stop Time 1515   OT Time Calculation (min) 45 min   Activity Tolerance Patient tolerated treatment well   Behavior During Therapy [facility] for tasks assessed/performed      Past Medical History  Diagnosis Date  . Hypertension   . Outbursts of anger   . Hyperlipemia     Past Surgical History  Procedure Laterality Date  . Rotator cuff repair      There were no vitals filed for this visit.  Visit Diagnosis:  Shoulder weakness  Tight fascia  Shoulder stiffness, right  Right shoulder pain                    OT Treatments/Exercises (OP) - 11/26/14 1502    Exercises   Exercises Shoulder   Shoulder Exercises: Supine   Protraction PROM;5 reps;Strengthening;10 reps   Protraction Weight (lbs) 2   Horizontal ABduction PROM;5 reps;Strengthening;10 reps   Horizontal ABduction Weight (lbs) 2   External Rotation PROM;5 reps;Strengthening;10 reps   External Rotation Weight (lbs) 2   Internal Rotation PROM;5 reps;Strengthening;10 reps   Internal Rotation Weight (lbs) 2   Flexion PROM;5 reps;Strengthening;10 reps   Shoulder Flexion Weight (lbs) 2   ABduction PROM;5 reps;Strengthening;10 reps   Shoulder ABduction Weight (lbs) 2   Shoulder Exercises: Standing   Protraction Strengthening;10 reps   Protraction Weight (lbs) 2   Horizontal ABduction Strengthening;12 reps   Horizontal ABduction Weight (lbs) 1   External Rotation Strengthening;10 reps   External Rotation  Weight (lbs) 2   Internal Rotation Strengthening;10 reps   Internal Rotation Weight (lbs) 2   Flexion Strengthening;10 reps   Shoulder Flexion Weight (lbs) 2   ABduction Strengthening;10 reps   Shoulder ABduction Weight (lbs) 1   Shoulder Exercises: ROM/Strengthening   "W" Arms 12X   X to V Arms 12X with 1#   Proximal Shoulder Strengthening, Supine 10X with 2#   Proximal Shoulder Strengthening, Seated 20X with 2#   Other ROM/Strengthening Exercises sliding cloth up pole positioned in corner, turning to left as she reaches end range X 20 repetiitons with excellent results   Manual Therapy   Manual Therapy Muscle Energy Technique   Myofascial Release Myofascial release to right upper arm, deltoid, trapezius, and scapularis regions to decrease pain and fascial restrictions and increase joint range of motion. Myofascial release to cervical region and sternocleidomastoid region.  Scar massage/release to lateral healed surgical incision this date.                    OT Short Term Goals - 11/24/14 1616    OT SHORT TERM GOAL #1   Title Patient will be educated and independent with HEP.   Time 6   Period Weeks   Status On-going   OT SHORT TERM GOAL #2   Title Patient will increase PROM to [facility] to increase ability to complete dressing tasks.    Time 6   Period Weeks  OT SHORT TERM GOAL #3   Title Patient will increase right shoulder strength to 3/5 to increase ability to hold onto light weight items.    Time 6   Period Weeks   Status On-going   OT SHORT TERM GOAL #4   Title Patient will decrease pain to 3/10 or less during daily tasks.   Time 6   Period Weeks   OT SHORT TERM GOAL #5   Title Patient will decrease fascial restrictions to mod amount.    Time 6   Period Weeks           OT Long Term Goals - 10/30/14 1126    OT LONG TERM GOAL #1   Title patient will return to highest level of independencen with all daily, leisure, and work related tasks.    Time 12    Period Weeks   Status On-going   OT LONG TERM GOAL #2   Title Patient will [facility] to increase ability to complete tasks above shoulder height.    Time 12   Period Weeks   Status On-going   OT LONG TERM GOAL #3   Title Patient will increase right shoulder strength to 4/5 to increase ability to return to work and perform required tasks.   Time 12   Period Weeks   Status On-going   OT LONG TERM GOAL #4   Title Patient will decrease pain level to 1/10 or less during daily tasks.   Time 12   Period Weeks   Status On-going   OT LONG TERM GOAL #5   Title Patient will decrease fascial restrictions to min amount.    Time 12   Period Weeks   Status On-going               Plan - 11/26/14 1548    Clinical Impression Statement A: Increased to 2# handweight supine. Patient was able to complete some standing A/ROM exercises with weight as well.    Plan P: Add ball on the wall if able to tolerate.        Problem List There are no active problems to display for this patient.   Limmie PatriciaLaura Gazelle Towe, OTR/L,CBIS  [phone]  11/26/2014, 3:50 PM   [facility] [address] [place], [place], [address] Phone: [phone]   Fax:  [phone]  Name: Deborah Baker MRN: [id number] Date of Birth: 1965-07-03

## 2014-12-02 ENCOUNTER — Ambulatory Visit (HOSPITAL_COMMUNITY): Payer: Worker's Compensation | Attending: Orthopedic Surgery | Admitting: Specialist

## 2014-12-02 DIAGNOSIS — M25511 Pain in right shoulder: Secondary | ICD-10-CM | POA: Insufficient documentation

## 2014-12-02 DIAGNOSIS — M25611 Stiffness of right shoulder, not elsewhere classified: Secondary | ICD-10-CM | POA: Insufficient documentation

## 2014-12-02 DIAGNOSIS — R29898 Other symptoms and signs involving the musculoskeletal system: Secondary | ICD-10-CM | POA: Insufficient documentation

## 2014-12-02 DIAGNOSIS — M629 Disorder of muscle, unspecified: Secondary | ICD-10-CM | POA: Insufficient documentation

## 2014-12-04 ENCOUNTER — Encounter (HOSPITAL_COMMUNITY): Payer: Self-pay | Admitting: Occupational Therapy

## 2014-12-04 ENCOUNTER — Ambulatory Visit (HOSPITAL_COMMUNITY): Payer: Worker's Compensation | Admitting: Occupational Therapy

## 2014-12-04 DIAGNOSIS — M25511 Pain in right shoulder: Secondary | ICD-10-CM

## 2014-12-04 DIAGNOSIS — M629 Disorder of muscle, unspecified: Secondary | ICD-10-CM

## 2014-12-04 DIAGNOSIS — M6289 Other specified disorders of muscle: Secondary | ICD-10-CM

## 2014-12-04 DIAGNOSIS — M25611 Stiffness of right shoulder, not elsewhere classified: Secondary | ICD-10-CM | POA: Diagnosis present

## 2014-12-04 DIAGNOSIS — R29898 Other symptoms and signs involving the musculoskeletal system: Secondary | ICD-10-CM | POA: Diagnosis present

## 2014-12-04 NOTE — Therapy (Signed)
[place], [place], [address] Phone: [phone]   Fax:  [phone]  Occupational Therapy Reassessment and Recertification  Patient Details  Name: Deborah Baker MRN: [id number] Date of Birth: December 29, 1965 Referring Provider: Marchia Bond, MD  Encounter Date: 12/04/2014      OT End of Session - 12/04/14 1603    Visit Number 18   Number of Visits 36   Date for OT Re-Evaluation 02/02/15  mini reassessment 01/01/2015   Authorization Type Worker's Comp   OT Start Time 1430   OT Stop Time 1516   OT Time Calculation (min) 46 min   Activity Tolerance Patient tolerated treatment well   Behavior During Therapy [facility] for tasks assessed/performed      Past Medical History  Diagnosis Date  . Hypertension   . Outbursts of anger   . Hyperlipemia     Past Surgical History  Procedure Laterality Date  . Rotator cuff repair      There were no vitals filed for this visit.  Visit Diagnosis:  Shoulder weakness  Tight fascia  Shoulder stiffness, right  Right shoulder pain      Subjective Assessment - 12/04/14 1433    Subjective  S: I've been having a lot of pain the last few days.    Currently in Pain? Yes   Pain Score 3    Pain Location Shoulder   Pain Orientation Right   Pain Descriptors / Indicators Aching   Pain Type Acute pain           [facility] OT Assessment - 12/04/14 1453    Assessment   Diagnosis Right rotator cuff repair   Precautions   Precautions Shoulder   Type of Shoulder Precautions 7-8 weeks post op (9/1-9/8): Wean from sling. PROM within tolerance level. Completed AAROM, scapular theraband exercises.   8-9 weeks post op (9/8-9/15): UBE, AROM, ER in sidelying, prone extension, row, horizontal abd. 9-14 weeks post op (9/15-10/20): initiate light stretching, rythmic stabilization, closed chain with wall and floor, plyometrics, cybex rows/press, general strengthening   AROM   Overall AROM  Comments Assessed supine then seated. ir/ER adducted   AROM Assessment Site Shoulder   Right/Left Shoulder Right   Right Shoulder Flexion --  supine 162 (previous 161), standing 165 (previous 151)    Right Shoulder ABduction --  supine 180 (same as previous), standing 178 (136 previous)   Right Shoulder Internal Rotation 90 Degrees  same as previous   Right Shoulder External Rotation --  supine 85 (previous 70), standing 85 (same as previous)   PROM   Overall PROM Comments Assessed supine. ir/ER adducted.   Right Shoulder Flexion 166 Degrees  previous 136   Right Shoulder ABduction 180 Degrees  previous 110   Right Shoulder Internal Rotation 90 Degrees  same as previous   Right Shoulder External Rotation 70 Degrees  previous 65   Strength   Right Shoulder Flexion 4/5  same as previous   Right Shoulder ABduction 4-/5  previous 3+/5   Right Shoulder Internal Rotation 4-/5  previous 3+/5   Right Shoulder External Rotation 5/5  same as previous                  OT Treatments/Exercises (OP) - 12/04/14 1458    Exercises   Exercises Shoulder   Shoulder Exercises: Supine   Protraction PROM;5 reps;Strengthening;15 reps   Protraction Weight (lbs) 2   Horizontal ABduction PROM;5 reps;Strengthening;15 reps   Horizontal ABduction  Weight (lbs) 2   External Rotation PROM;5 reps;Strengthening;15 reps   External Rotation Weight (lbs) 2   Internal Rotation PROM;5 reps;Strengthening;15 reps   Internal Rotation Weight (lbs) 2   Flexion PROM;5 reps;Strengthening;15 reps   Shoulder Flexion Weight (lbs) 2   ABduction PROM;5 reps;Strengthening;15 reps   Shoulder ABduction Weight (lbs) 2   Shoulder Exercises: Standing   Protraction Strengthening;15 reps   Protraction Weight (lbs) 2   Horizontal ABduction Strengthening;15 reps   Horizontal ABduction Weight (lbs) 2   External Rotation Strengthening;15 reps   External Rotation Weight (lbs) 2   Internal Rotation Strengthening;15  reps   Internal Rotation Weight (lbs) 2   Flexion Strengthening;12 reps   Shoulder Flexion Weight (lbs) 2   ABduction Strengthening;10 reps   Shoulder ABduction Weight (lbs) 2   Shoulder Exercises: ROM/Strengthening   Proximal Shoulder Strengthening, Seated 25X with 2#   Manual Therapy   Manual Therapy Muscle Energy Technique   Myofascial Release Myofascial release to right upper arm, deltoid, trapezius, and scapularis regions to decrease pain and fascial restrictions and increase joint range of motion. Myofascial release to cervical region and sternocleidomastoid region.  Scar massage/release to lateral healed surgical incision this date.                   OT Short Term Goals - 12/04/14 1459    OT SHORT TERM GOAL #1   Title Patient will be educated and independent with HEP.   Time 6   Period Weeks   Status On-going   OT SHORT TERM GOAL #2   Title Patient will increase PROM to [facility] to increase ability to complete dressing tasks.    Time 6   Period Weeks   OT SHORT TERM GOAL #3   Title Patient will increase right shoulder strength to 3/5 to increase ability to hold onto light weight items.    Time 6   Period Weeks   Status Achieved   OT SHORT TERM GOAL #4   Title Patient will decrease pain to 3/10 or less during daily tasks.   Time 6   Period Weeks   OT SHORT TERM GOAL #5   Title Patient will decrease fascial restrictions to mod amount.    Time 6   Period Weeks           OT Long Term Goals - 12/04/14 1500    OT LONG TERM GOAL #1   Title patient will return to highest level of independencen with all daily, leisure, and work related tasks.    Time 12   Period Weeks   Status On-going   OT LONG TERM GOAL #2   Title Patient will increase AROM to [facility] to increase ability to complete tasks above shoulder height.    Time 12   Period Weeks   Status Achieved   OT LONG TERM GOAL #3   Title Patient will increase right shoulder strength to 4/5 to increase ability to  return to work and perform required tasks.   Time 12   Period Weeks   Status On-going   OT LONG TERM GOAL #4   Title Patient will decrease pain level to 1/10 or less during daily tasks.   Time 12   Period Weeks   Status On-going   OT LONG TERM GOAL #5   Title Patient will decrease fascial restrictions to min amount.    Time 12   Period Weeks   Status On-going  Plan - 12/04/14 1604    Clinical Impression Statement A: Reassessment and recertification completed this session. Pt has met 4/5 STGs and 1/5 LTGs, pt has increased A/ROM to [facility], continues to have weakness of shoulder and increased pain. Pt reports she is now using her shoulder for all ADL tasks, however continues to have pain at times and does not lift anything per MD orders.    Plan P: Continue therapy 2x/week for additional weeks, continuing to work on increasing P/ROM and A/ROM with passive stretching, focus on strengthening exercises and scapular mobility/stability for increased functional use of RUE during daily and work tasks. Next session: Add ball on wall.         Problem List There are no active problems to display for this patient.   Guadelupe Sabin, OTR/L  [phone]  12/04/2014, 4:12 PM  [place] [address] [place], [place], [address] Phone: [phone]   Fax:  [phone]  Name: Deborah Baker MRN: [id number] Date of Birth: 02-17-1965

## 2014-12-08 ENCOUNTER — Encounter (HOSPITAL_COMMUNITY): Payer: Self-pay

## 2014-12-08 ENCOUNTER — Ambulatory Visit (HOSPITAL_COMMUNITY): Payer: Worker's Compensation

## 2014-12-08 DIAGNOSIS — R29898 Other symptoms and signs involving the musculoskeletal system: Secondary | ICD-10-CM

## 2014-12-08 DIAGNOSIS — M25611 Stiffness of right shoulder, not elsewhere classified: Secondary | ICD-10-CM

## 2014-12-08 DIAGNOSIS — M6289 Other specified disorders of muscle: Secondary | ICD-10-CM

## 2014-12-08 DIAGNOSIS — M629 Disorder of muscle, unspecified: Secondary | ICD-10-CM

## 2014-12-08 NOTE — Therapy (Signed)
[facility] [address] [place], [place], [address] Phone: [phone]   Fax:  [phone]  Occupational Therapy Treatment  Patient Details  Name: Deborah Baker MRN: [id number] Date of Birth: 08/17/1965 Referring Provider: Teryl Lucy, MD  Encounter Date: 12/08/2014      OT End of Session - 12/08/14 1508    Visit Number 19   Number of Visits 36   Date for OT Re-Evaluation 02/02/15  mini reassessment 01/01/2015   Authorization Type Worker's Comp   OT Start Time 1352   OT Stop Time 1430   OT Time Calculation (min) 38 min   Activity Tolerance Patient tolerated treatment well   Behavior During Therapy [facility] for tasks assessed/performed      Past Medical History  Diagnosis Date  . Hypertension   . Outbursts of anger   . Hyperlipemia     Past Surgical History  Procedure Laterality Date  . Rotator cuff repair      There were no vitals filed for this visit.  Visit Diagnosis:  Shoulder weakness  Tight fascia  Shoulder stiffness, right      Subjective Assessment - 12/08/14 1417    Subjective  S: It's aching. It's not sore or painful. I think I may be working it too hard.    Currently in Pain? No/denies                      OT Treatments/Exercises (OP) - 12/08/14 1419    Exercises   Exercises Shoulder   Shoulder Exercises: Supine   Protraction PROM;5 reps;Strengthening;15 reps   Protraction Weight (lbs) 2   Horizontal ABduction PROM;5 reps;Strengthening;15 reps   Horizontal ABduction Weight (lbs) 2   External Rotation PROM;5 reps;Strengthening;15 reps   External Rotation Weight (lbs) 2   Internal Rotation PROM;5 reps;Strengthening;15 reps   Internal Rotation Weight (lbs) 2   Flexion PROM;5 reps;Strengthening;15 reps   Shoulder Flexion Weight (lbs) 2   ABduction PROM;5 reps;Strengthening;15 reps   Shoulder ABduction Weight (lbs) 2   Shoulder Exercises: Standing   Protraction Strengthening;15 reps    Protraction Weight (lbs) 2   Horizontal ABduction Strengthening;15 reps   Horizontal ABduction Weight (lbs) 2   External Rotation Strengthening;15 reps   External Rotation Weight (lbs) 2   Internal Rotation Strengthening;15 reps   Internal Rotation Weight (lbs) 2   Flexion Strengthening;12 reps   Shoulder Flexion Weight (lbs) 2   ABduction Strengthening;10 reps   Shoulder ABduction Weight (lbs) 2   Other Standing Exercises Push Press 2#; 15X   Shoulder Exercises: ROM/Strengthening   Proximal Shoulder Strengthening, Supine 15X with 3#   Proximal Shoulder Strengthening, Seated 15X with 3#   Ball on Wall 1' flexion 1' abduction green ball   Manual Therapy   Manual Therapy Muscle Energy Technique   Myofascial Release Myofascial release to right upper arm, deltoid, trapezius, and scapularis regions to decrease pain and fascial restrictions and increase joint range of motion. Myofascial release to cervical region and sternocleidomastoid region.  Scar massage/release to lateral healed surgical incision this date.                    OT Short Term Goals - 12/08/14 1549    OT SHORT TERM GOAL #1   Title Patient will be educated and independent with HEP.   Time 6   Period Weeks   Status On-going   OT SHORT TERM GOAL #2   Title Patient will increase  [facility] to [facility] to increase ability to complete dressing tasks.    Time 6   Period Weeks   OT SHORT TERM GOAL #3   Title Patient will increase right shoulder strength to 3/5 to increase ability to hold onto light weight items.    Time 6   Period Weeks   OT SHORT TERM GOAL #4   Title Patient will decrease pain to 3/10 or less during daily tasks.   Time 6   Period Weeks   OT SHORT TERM GOAL #5   Title Patient will decrease fascial restrictions to mod amount.    Time 6   Period Weeks           OT Long Term Goals - 12/08/14 1549    OT LONG TERM GOAL #1   Title patient will return to highest level of independencen with all  daily, leisure, and work related tasks.    Time 12   Period Weeks   Status On-going   OT LONG TERM GOAL #2   Title Patient will [facility] to [facility] to increase ability to complete tasks above shoulder height.    Time 12   Period Weeks   OT LONG TERM GOAL #3   Title Patient will increase right shoulder strength to 4/5 to increase ability to return to work and perform required tasks.   Time 12   Period Weeks   Status On-going   OT LONG TERM GOAL #4   Title Patient will decrease pain level to 1/10 or less during daily tasks.   Time 12   Period Weeks   Status On-going   OT LONG TERM GOAL #5   Title Patient will decrease fascial restrictions to min amount.    Time 12   Period Weeks   Status On-going               Plan - 12/08/14 1508    Clinical Impression Statement A: Added ball on the wall with patient reporting fatigue at end of session. Provided VC for form and technique during all exercises.    Plan P: Add cybex row and press.        Problem List There are no active problems to display for this patient.   Limmie PatriciaLaura Dodi Leu, OTR/L,CBIS  [phone]  12/08/2014, 3:50 PM  [facility] [address] [place], [place], [address] Phone: [phone]   Fax:  [phone]  Name: Deborah Baker MRN: [id number] Date of Birth: 1965/03/16

## 2014-12-11 ENCOUNTER — Ambulatory Visit (HOSPITAL_COMMUNITY): Payer: Worker's Compensation | Admitting: Specialist

## 2014-12-11 DIAGNOSIS — M629 Disorder of muscle, unspecified: Secondary | ICD-10-CM

## 2014-12-11 DIAGNOSIS — M25611 Stiffness of right shoulder, not elsewhere classified: Secondary | ICD-10-CM

## 2014-12-11 DIAGNOSIS — R29898 Other symptoms and signs involving the musculoskeletal system: Secondary | ICD-10-CM

## 2014-12-11 DIAGNOSIS — M6289 Other specified disorders of muscle: Secondary | ICD-10-CM

## 2014-12-11 DIAGNOSIS — M25511 Pain in right shoulder: Secondary | ICD-10-CM

## 2014-12-11 NOTE — Therapy (Signed)
[place] [facility] [address] [place], [place], [address] Phone: [phone]   Fax:  [phone]  Occupational Therapy Treatment  Patient Details  Name: Deborah Baker MRN: [id number] Date of Birth: [birth year] Referring Provider: Teryl Lucy, MD  Encounter Date: 12/11/2014      OT End of Session - 12/11/14 1556    Visit Number 20   Number of Visits 36   Date for OT Re-Evaluation 02/02/15  01/01/15 mini reassessment   Authorization Type Worker's Comp   OT Start Time 1352   OT Stop Time 1435   OT Time Calculation (min) 43 min   Activity Tolerance Patient tolerated treatment well   Behavior During Therapy [facility] for tasks assessed/performed      Past Medical History  Diagnosis Date  . Hypertension   . Outbursts of anger   . Hyperlipemia     Past Surgical History  Procedure Laterality Date  . Rotator cuff repair      There were no vitals filed for this visit.  Visit Diagnosis:  Shoulder weakness  Tight fascia  Shoulder stiffness, right  Right shoulder pain      Subjective Assessment - 12/11/14 1551    Subjective  S:  Its always tight, and I can do everything but reach out to the side and overhead.    Currently in Pain? Yes   Pain Score 2    Pain Location Shoulder   Pain Orientation Right   Pain Descriptors / Indicators Aching   Pain Type Acute pain            [facility] OT Assessment - 12/11/14 0001    Assessment   Diagnosis Right rotator cuff repair   Precautions   Precautions Shoulder   Type of Shoulder Precautions 7-8 weeks post op (9/1-9/8): Wean from sling. PROM within tolerance level. Completed AAROM, scapular theraband exercises.   8-9 weeks post op (9/8-9/15): UBE, AROM, ER in sidelying, prone extension, row, horizontal abd. 9-14 weeks post op (9/15-10/20): initiate light stretching, rythmic stabilization, closed chain with wall and floor, plyometrics, cybex rows/press, general strengthening                   OT Treatments/Exercises (OP) - 12/11/14 0001    Exercises   Exercises Shoulder   Shoulder Exercises: Supine   Protraction PROM;5 reps;Strengthening;15 reps   Protraction Weight (lbs) 2   Horizontal ABduction PROM;5 reps;Strengthening;15 reps   Horizontal ABduction Weight (lbs) 2   External Rotation PROM;5 reps;Strengthening;15 reps   External Rotation Weight (lbs) 2   Internal Rotation PROM;5 reps;Strengthening;15 reps   Internal Rotation Weight (lbs) 2   Flexion PROM;5 reps;Strengthening;15 reps   Shoulder Flexion Weight (lbs) 2   ABduction PROM;5 reps;Strengthening;15 reps   Shoulder ABduction Weight (lbs) 2   Shoulder Exercises: Prone   Retraction Strengthening;10 reps   Retraction Weight (lbs) 2   Flexion Strengthening;10 reps   Flexion Weight (lbs) 2   Extension Strengthening;10 reps   Extension Weight (lbs) 2   Horizontal ABduction 1 Strengthening;10 reps   Horizontal ABduction 1 Weight (lbs) 2   Horizontal ABduction 2 Strengthening;10 reps   Horizontal ABduction 2 Weight (lbs) 2   Shoulder Exercises: Sidelying   External Rotation Strengthening;10 reps   External Rotation Weight (lbs) 2   Internal Rotation Strengthening;10 reps   Internal Rotation Weight (lbs) 2   Flexion Strengthening;10 reps   Flexion Weight (lbs) 2   ABduction Strengthening;10 reps   ABduction Weight (lbs) 2  Shoulder Exercises: ROM/Strengthening   UBE (Upper Arm Bike) level 2 5 ' forward and 1' reverse   Ball on Wall 1' flexion 1' abduction green ball   Manual Therapy   Manual Therapy Myofascial release   Myofascial Release Myofascial release to right upper arm, deltoid, trapezius, and scapularis regions to decrease pain and fascial restrictions and increase joint range of motion. Myofascial release to cervical region and sternocleidomastoid region.  Scar massage/release to lateral healed surgical incision this date.                  OT Education - 12/11/14  1556    Education provided Yes   Education Details sidelying abduction and flexion strengthening to assist with mid range overhead reach   Person(s) Educated Patient   Methods Explanation;Demonstration   Comprehension Verbalized understanding;Returned demonstration          OT Short Term Goals - 12/08/14 1549    OT SHORT TERM GOAL #1   Title Patient will be educated and independent with HEP.   Time 6   Period Weeks   Status On-going   OT SHORT TERM GOAL #2   Title Patient will [facility] to increase ability to complete dressing tasks.    Time 6   Period Weeks   OT SHORT TERM GOAL #3   Title Patient will increase right shoulder strength to 3/5 to increase ability to hold onto light weight items.    Time 6   Period Weeks   OT SHORT TERM GOAL #4   Title Patient will decrease pain to 3/10 or less during daily tasks.   Time 6   Period Weeks   OT SHORT TERM GOAL #5   Title Patient will decrease fascial restrictions to mod amount.    Time 6   Period Weeks           OT Long Term Goals - 12/08/14 1549    OT LONG TERM GOAL #1   Title patient will return to highest level of independencen with all daily, leisure, and work related tasks.    Time 12   Period Weeks   Status On-going   OT LONG TERM GOAL #2   Title Patient will increase [facility] to [facility] to increase ability to complete tasks above shoulder height.    Time 12   Period Weeks   OT LONG TERM GOAL #3   Title Patient will increase right shoulder strength to 4/5 to increase ability to return to work and perform required tasks.   Time 12   Period Weeks   Status On-going   OT LONG TERM GOAL #4   Title Patient will decrease pain level to 1/10 or less during daily tasks.   Time 12   Period Weeks   Status On-going   OT LONG TERM GOAL #5   Title Patient will decrease fascial restrictions to min amount.    Time 12   Period Weeks   Status On-going               Plan - 12/11/14 1557    Clinical  Impression Statement A:  Added sidelying and prone strengthening this date for improved ability to reach overhead.  Patient has most difficulty with 80-120 range (supraspinatus).   Plan P:  Increase resistance with cybex press and row and add body blade for strengthening - focus on 80-120 degree flexion and abduction.          Problem List There are no  active problems to display for this patient.   Shirlean Mylar, OTR/L [phone]  12/11/2014, 4:05 PM   [facility] [address] [place], [place], [address] Phone: [phone]   Fax:  [phone]  Name: Deborah Baker MRN: [id number] Date of Birth: May 06, 1965

## 2014-12-15 ENCOUNTER — Ambulatory Visit (HOSPITAL_COMMUNITY): Payer: Worker's Compensation | Admitting: Occupational Therapy

## 2014-12-18 ENCOUNTER — Ambulatory Visit (HOSPITAL_COMMUNITY): Payer: Worker's Compensation

## 2014-12-22 ENCOUNTER — Ambulatory Visit (HOSPITAL_COMMUNITY): Payer: Worker's Compensation | Admitting: Occupational Therapy

## 2014-12-23 ENCOUNTER — Telehealth (HOSPITAL_COMMUNITY): Payer: Self-pay

## 2014-12-23 ENCOUNTER — Ambulatory Visit (HOSPITAL_COMMUNITY): Payer: Worker's Compensation

## 2014-12-23 NOTE — Telephone Encounter (Signed)
DATE: 12/23/14  Called and left message regarding no show for today's appt at 1:00PM on 12/23/14. Pt has actually had 3 no shows and our policy is to discharge. Informed patient that we could like to hear back from her first before we go ahead with the discharge. Requested that patient call us back to confirm her next appt on 12/29/14.  Limmie PatriciaLaura Oniel Meleski, OTR/L,CBIS  [phone]

## 2014-12-29 ENCOUNTER — Encounter (HOSPITAL_COMMUNITY): Payer: Self-pay

## 2014-12-29 ENCOUNTER — Ambulatory Visit (HOSPITAL_COMMUNITY): Payer: Worker's Compensation

## 2014-12-29 DIAGNOSIS — R29898 Other symptoms and signs involving the musculoskeletal system: Secondary | ICD-10-CM

## 2014-12-29 DIAGNOSIS — M25611 Stiffness of right shoulder, not elsewhere classified: Secondary | ICD-10-CM

## 2014-12-29 DIAGNOSIS — M629 Disorder of muscle, unspecified: Secondary | ICD-10-CM

## 2014-12-29 DIAGNOSIS — M25511 Pain in right shoulder: Secondary | ICD-10-CM

## 2014-12-29 DIAGNOSIS — M6289 Other specified disorders of muscle: Secondary | ICD-10-CM

## 2014-12-29 NOTE — Therapy (Signed)
[facility] [address] [place], [place], [address] Phone: [phone]   Fax:  [phone]  Occupational Therapy Treatment  Patient Details  Name: Deborah Baker MRN: [id number] Date of Birth: [birth year] Referring Provider: Teryl Lucy, MD  Encounter Date: 12/29/2014      OT End of Session - 12/29/14 1740    Visit Number 21   Number of Visits 36   Date for OT Re-Evaluation 02/02/15  01/01/15 mini reassessment   Authorization Type Worker's Comp   OT Start Time 1608   OT Stop Time 1650   OT Time Calculation (min) 42 min   Activity Tolerance Patient tolerated treatment well   Behavior During Therapy [facility] for tasks assessed/performed      Past Medical History  Diagnosis Date  . Hypertension   . Outbursts of anger   . Hyperlipemia     Past Surgical History  Procedure Laterality Date  . Rotator cuff repair      There were no vitals filed for this visit.  Visit Diagnosis:  Tight fascia  Shoulder weakness  Shoulder stiffness, right  Right shoulder pain      Subjective Assessment - 12/29/14 1736    Subjective  S: The doctor told me to take a week off. I went back to work and they put me on 12-14 hours days. My shoulder couldn't take it. Now I'm back off work.   Currently in Pain? Yes   Pain Score 4    Pain Location Shoulder   Pain Orientation Right   Pain Descriptors / Indicators Radiating;Throbbing;Tender   Pain Type Acute pain            [facility] OT Assessment - 12/29/14 1738    Assessment   Diagnosis Right rotator cuff repair   Precautions   Precautions Shoulder   Type of Shoulder Precautions 7-8 weeks post op (9/1-9/8): Wean from sling. PROM within tolerance level. Completed AAROM, scapular theraband exercises.   8-9 weeks post op (9/8-9/15): UBE, AROM, ER in sidelying, prone extension, row, horizontal abd. 9-14 weeks post op (9/15-10/20): initiate light stretching, rythmic stabilization, closed chain with  wall and floor, plyometrics, cybex rows/press, general strengthening                  OT Treatments/Exercises (OP) - 12/29/14 1738    Exercises   Exercises Shoulder   Shoulder Exercises: Supine   Protraction PROM;5 reps;AROM;12 reps   Horizontal ABduction PROM;5 reps;AROM;12 reps   External Rotation PROM;5 reps;AROM;12 reps   Internal Rotation PROM;5 reps;AROM;12 reps   Flexion PROM;5 reps;AROM   ABduction PROM;5 reps;AROM;12 reps   Ultrasound   Ultrasound Location right upper trapezius   Ultrasound Parameters 3 mHz 1.5 intensity 5 minutes   Ultrasound Goals Pain   Manual Therapy   Manual Therapy Myofascial release   Manual therapy comments Manual therapy completed prior to exercises.   Myofascial Release Myofascial release to right upper arm, deltoid, trapezius, and scapularis regions to decrease pain and fascial restrictions and increase joint range of motion. Myofascial release to cervical region and sternocleidomastoid region.  Scar massage/release to lateral healed surgical incision this date.                    OT Short Term Goals - 12/08/14 1549    OT SHORT TERM GOAL #1   Title Patient will be educated and independent with HEP.   Time 6   Period Weeks   Status On-going  OT SHORT TERM GOAL #2   Title Patient will [facility] to [facility] to increase ability to complete dressing tasks.    Time 6   Period Weeks   OT SHORT TERM GOAL #3   Title Patient will increase right shoulder strength to 3/5 to increase ability to hold onto light weight items.    Time 6   Period Weeks   OT SHORT TERM GOAL #4   Title Patient will decrease pain to 3/10 or less during daily tasks.   Time 6   Period Weeks   OT SHORT TERM GOAL #5   Title Patient will decrease fascial restrictions to mod amount.    Time 6   Period Weeks           OT Long Term Goals - 12/08/14 1549    OT LONG TERM GOAL #1   Title patient will return to highest level of independencen with all  daily, leisure, and work related tasks.    Time 12   Period Weeks   Status On-going   OT LONG TERM GOAL #2   Title Patient will increase [facility]OM to [facility] to increase ability to complete tasks above shoulder height.    Time 12   Period Weeks   OT LONG TERM GOAL #3   Title Patient will increase right shoulder strength to 4/5 to increase ability to return to work and perform required tasks.   Time 12   Period Weeks   Status On-going   OT LONG TERM GOAL #4   Title Patient will decrease pain level to 1/10 or less during daily tasks.   Time 12   Period Weeks   Status On-going   OT LONG TERM GOAL #5   Title Patient will decrease fascial restrictions to min amount.    Time 12   Period Weeks   Status On-going               Plan - 12/29/14 1740    Clinical Impression Statement A: Patient reports that MD thinks she may have a pinched nerve which is causing the increased pain in upper trapezius/lateral cervical region. patient with increased trigger points and tenderness in that region. Completed gentle ROM exercises followed by US for pain and decreased swelling.    Plan P: Follow up on pain and US use. Mini reassessment. Focus on relieving pinched nerve in right upper trapezius region.        Problem List There are no active problems to display for this patient.   Limmie PatriciaLaura Ambera Fedele, OTR/L,CBIS  [phone]  12/29/2014, 5:43 PM  [facility] [address] [place], [place], [address] Phone: [phone]   Fax:  [phone]  Name: August Albinoenny L Fata MRN: [id number] Date of Birth: 1965-05-18

## 2015-01-01 ENCOUNTER — Ambulatory Visit (HOSPITAL_COMMUNITY): Payer: Worker's Compensation | Attending: Orthopedic Surgery

## 2015-01-01 ENCOUNTER — Encounter (HOSPITAL_COMMUNITY): Payer: Self-pay

## 2015-01-01 DIAGNOSIS — M25611 Stiffness of right shoulder, not elsewhere classified: Secondary | ICD-10-CM

## 2015-01-01 DIAGNOSIS — M629 Disorder of muscle, unspecified: Secondary | ICD-10-CM | POA: Diagnosis not present

## 2015-01-01 DIAGNOSIS — M6289 Other specified disorders of muscle: Secondary | ICD-10-CM

## 2015-01-01 DIAGNOSIS — R29898 Other symptoms and signs involving the musculoskeletal system: Secondary | ICD-10-CM

## 2015-01-01 DIAGNOSIS — M25511 Pain in right shoulder: Secondary | ICD-10-CM | POA: Diagnosis present

## 2015-01-01 NOTE — Therapy (Signed)
[facility] [address] [place], [place], [address] Phone: [phone]   Fax:  [phone]  Occupational Therapy Treatment and mini reassessment   Patient Details  Name: Deborah Baker MRN: [id number] Date of Birth: 03/28/1965 Referring Provider: Teryl LucyJoshua Landau, MD  Encounter Date: 01/01/2015      OT End of Session - 01/01/15 1425    Visit Number 22   Number of Visits 36   Date for OT Re-Evaluation 02/02/15  01/01/15 mini reassessment   Authorization Type Worker's Comp   OT Start Time 1305  mini reassessment   OT Stop Time 1349   OT Time Calculation (min) 44 min   Activity Tolerance Patient tolerated treatment well   Behavior During Therapy [facility] for tasks assessed/performed      Past Medical History  Diagnosis Date  . Hypertension   . Outbursts of anger   . Hyperlipemia     Past Surgical History  Procedure Laterality Date  . Rotator cuff repair      There were no vitals filed for this visit.  Visit Diagnosis:  Tight fascia  Shoulder weakness  Shoulder stiffness, right  Right shoulder pain      Subjective Assessment - 01/01/15 1422    Subjective  S: When you touch it the pain is nauseating.    Currently in Pain? Yes   Pain Score 4    Pain Location Shoulder   Pain Orientation Right   Pain Descriptors / Indicators Radiating;Throbbing;Tender   Pain Type Acute pain            [facility] - 01/01/15 1324    Assessment   Diagnosis Right rotator cuff repair   Precautions   Precautions Shoulder   Type of Shoulder Precautions 7-8 weeks post op (9/1-9/8): Wean from sling. PROM within tolerance level. Completed AAROM, scapular theraband exercises.   8-9 weeks post op (9/8-9/15): UBE, AROM, ER in sidelying, prone extension, row, horizontal abd. 9-14 weeks post op (9/15-10/20): initiate light stretching, rythmic stabilization, closed chain with wall and floor, plyometrics, cybex rows/press, general  strengthening   AROM   Overall AROM Comments Assessed supine then seated. IR/er adducted   AROM Assessment Site Shoulder   Right/Left Shoulder Right   Right Shoulder Flexion --  Supine: 130 (now) 162 (previous) Seated: 128 (now) 165 (prev   Right Shoulder ABduction --  supine: 100 (now) 180 (previous) seated: 65 (now) 178 (prev.   Right Shoulder Internal Rotation 90 Degrees   Right Shoulder External Rotation --  supine: 70 (now) 85 (previous) Standing: 58 (now) 85 (prev.)   PROM   Overall PROM Comments Assessed supine. IR/er adducted.   PROM Assessment Site Shoulder   Right/Left Shoulder Right   Right Shoulder Flexion 143 Degrees  previous: 166   Right Shoulder ABduction 116 Degrees  previous: 180   Right Shoulder Internal Rotation 90 Degrees  previous: 90   Right Shoulder External Rotation 56 Degrees  previous: 70   Strength   Overall Strength Comments Assessed seated. ER/ir adducted.    Strength Assessment Site Shoulder   Right/Left Shoulder Right   Right Shoulder Flexion 3/5  previous: 4/5   Right Shoulder ABduction 3-/5  previous; 4-/5   Right Shoulder Internal Rotation 3/5  previous: 4-/6   Right Shoulder External Rotation 3/5  previous: 5/5                  OT Treatments/Exercises (OP) - 01/01/15 1423    Exercises  Exercises Shoulder   Shoulder Exercises: Supine   Protraction PROM;10 reps   Horizontal ABduction PROM;10 reps   External Rotation PROM;10 reps   Internal Rotation PROM;10 reps   Flexion PROM;10 reps   ABduction PROM;10 reps   Modalities   Modalities Ultrasound   Ultrasound   Ultrasound Location Right upper trapezius   Ultrasound Parameters 3 mHz 1.5 intensity   Ultrasound Goals Pain   Manual Therapy   Manual Therapy Myofascial release   Manual therapy comments Manual therapy completed prior to exercises.   Myofascial Release Myofascial release to right upper arm, deltoid, trapezius, and scapularis regions to decrease pain and  fascial restrictions and increase joint range of motion. Myofascial release to cervical region and sternocleidomastoid region.  Scar massage/release to lateral healed surgical incision this date.                    OT Short Term Goals - 12/08/14 1549    OT SHORT TERM GOAL #1   Title Patient will be educated and independent with HEP.   Time 6   Period Weeks   Status On-going   OT SHORT TERM GOAL #2   Title Patient will increase PROM to [facility] to increase ability to complete dressing tasks.    Time 6   Period Weeks   OT SHORT TERM GOAL #3   Title Patient will increase right shoulder strength to 3/5 to increase ability to hold onto light weight items.    Time 6   Period Weeks   OT SHORT TERM GOAL #4   Title Patient will decrease pain to 3/10 or less during daily tasks.   Time 6   Period Weeks   OT SHORT TERM GOAL #5   Title Patient will decrease fascial restrictions to mod amount.    Time 6   Period Weeks           OT Long Term Goals - 12/08/14 1549    OT LONG TERM GOAL #1   Title patient will return to highest level of independencen with all daily, leisure, and work related tasks.    Time 12   Period Weeks   Status On-going   OT LONG TERM GOAL #2   Title Patient will increase AROM to [facility] to increase ability to complete tasks above shoulder height.    Time 12   Period Weeks   OT LONG TERM GOAL #3   Title Patient will increase right shoulder strength to 4/5 to increase ability to return to work and perform required tasks.   Time 12   Period Weeks   Status On-going   OT LONG TERM GOAL #4   Title Patient will decrease pain level to 1/10 or less during daily tasks.   Time 12   Period Weeks   Status On-going   OT LONG TERM GOAL #5   Title Patient will decrease fascial restrictions to min amount.    Time 12   Period Weeks   Status On-going               Plan - 01/01/15 1425    Clinical Impression Statement A: Pt arrives to therapy with continued  complaints of right upper trapezius pain that will shot up neck or radiate down to forearm. Patient requested [place] at end of session and reports that when [place] head moved towards lateral region of shoulder the pain became nauseating. Mini reassessment was completed this date. Patient has shown no improvement and instead has  regressed in therapy as her measurements and strength have significantly decreased. Pt reports that it is extremely hard to do anything with her Right arm in this much pain.    Plan P: Follow up on pain. Attempt AA/ROM exercises supine. Focus on relieving pinched nerve in right upper trapezius region.         Problem List There are no active problems to display for this patient.   Limmie Patricia, OTR/L,CBIS  [phone]  01/01/2015, 2:49 PM  [facility] [address] [place], [place], [address] Phone: [phone]   Fax:  [phone]  Name: Deborah Baker MRN: [id number] Date of Birth: [birth year]

## 2015-01-05 ENCOUNTER — Encounter (HOSPITAL_COMMUNITY): Payer: Self-pay

## 2015-01-05 ENCOUNTER — Ambulatory Visit (HOSPITAL_COMMUNITY): Payer: Worker's Compensation

## 2015-01-05 DIAGNOSIS — M629 Disorder of muscle, unspecified: Secondary | ICD-10-CM | POA: Diagnosis not present

## 2015-01-05 DIAGNOSIS — M25511 Pain in right shoulder: Secondary | ICD-10-CM

## 2015-01-05 DIAGNOSIS — R29898 Other symptoms and signs involving the musculoskeletal system: Secondary | ICD-10-CM

## 2015-01-05 DIAGNOSIS — M6289 Other specified disorders of muscle: Secondary | ICD-10-CM

## 2015-01-05 DIAGNOSIS — M25611 Stiffness of right shoulder, not elsewhere classified: Secondary | ICD-10-CM

## 2015-01-05 NOTE — Therapy (Signed)
[facility] [address] [place], [place], [address] Phone: [phone]   Fax:  [phone]  Occupational Therapy Treatment  Patient Details  Name: Deborah Baker MRN: [id number] Date of Birth: Apr 22, 1965 Referring Provider: Teryl LucyJoshua Landau, MD  Encounter Date: 01/05/2015      OT End of Session - 01/05/15 1516    Visit Number 23   Number of Visits 36   Date for OT Re-Evaluation 02/02/15  01/01/15 mini reassessment   Authorization Type Worker's Comp   OT Start Time 1350   OT Stop Time 1430   OT Time Calculation (min) 40 min   Activity Tolerance Patient tolerated treatment well   Behavior During Therapy [facility] for tasks assessed/performed      Past Medical History  Diagnosis Date  . Hypertension   . Outbursts of anger   . Hyperlipemia     Past Surgical History  Procedure Laterality Date  . Rotator cuff repair      There were no vitals filed for this visit.  Visit Diagnosis:  Tight fascia  Shoulder weakness  Shoulder stiffness, right  Right shoulder pain      Subjective Assessment - 01/05/15 1408    Subjective  S: The pain is still there. It's just not as bad.   Currently in Pain? Yes   Pain Score 2    Pain Location Shoulder   Pain Orientation Right   Pain Descriptors / Indicators Aching;Throbbing;Tender   Pain Type Acute pain            [facility] - 01/05/15 1409    Assessment   Diagnosis Right rotator cuff repair   Precautions   Precautions Shoulder   Type of Shoulder Precautions 7-8 weeks post op (9/1-9/8): Wean from sling. PROM within tolerance level. Completed AAROM, scapular theraband exercises.   8-9 weeks post op (9/8-9/15): UBE, AROM, ER in sidelying, prone extension, row, horizontal abd. 9-14 weeks post op (9/15-10/20): initiate light stretching, rythmic stabilization, closed chain with wall and floor, plyometrics, cybex rows/press, general strengthening                  OT  Treatments/Exercises (OP) - 01/05/15 1409    Exercises   Exercises Shoulder   Shoulder Exercises: Supine   Protraction PROM;10 reps;AAROM;15 reps   Horizontal ABduction PROM;10 reps;AAROM;15 reps   External Rotation PROM;5 reps;AAROM;15 reps   Internal Rotation PROM;5 reps;AAROM;15 reps   Flexion PROM;5 reps;AAROM;15 reps   ABduction PROM;5 reps;AAROM;15 reps   Shoulder Exercises: Standing   Protraction AROM;15 reps   Horizontal ABduction AROM;15 reps   External Rotation AROM;15 reps   Internal Rotation AROM;15 reps   Flexion AROM;15 reps   ABduction AAROM;15 reps   Modalities   Modalities Ultrasound   Ultrasound   Ultrasound Location Right upper arm   Ultrasound Parameters 3 mHz 1.5 intensity   Ultrasound Goals Pain   Manual Therapy   Manual Therapy Myofascial release   Manual therapy comments Manual therapy completed prior to exercises.   Myofascial Release Myofascial release to right upper arm, deltoid, trapezius, and scapularis regions to decrease pain and fascial restrictions and increase joint range of motion. Myofascial release to cervical region and sternocleidomastoid region.  Scar massage/release to lateral healed surgical incision this date.                    OT Short Term Goals - 12/08/14 1549    OT SHORT TERM GOAL #1   Title  Patient will be educated and independent with HEP.   Time 6   Period Weeks   Status On-going   OT SHORT TERM GOAL #2   Title Patient will increase PROM to [facility] to increase ability to complete dressing tasks.    Time 6   Period Weeks   OT SHORT TERM GOAL #3   Title Patient will increase right shoulder strength to 3/5 to increase ability to hold onto light weight items.    Time 6   Period Weeks   OT SHORT TERM GOAL #4   Title Patient will decrease pain to 3/10 or less during daily tasks.   Time 6   Period Weeks   OT SHORT TERM GOAL #5   Title Patient will decrease fascial restrictions to mod amount.    Time 6   Period Weeks            OT Long Term Goals - 12/08/14 1549    OT LONG TERM GOAL #1   Title patient will return to highest level of independencen with all daily, leisure, and work related tasks.    Time 12   Period Weeks   Status On-going   OT LONG TERM GOAL #2   Title Patient will increase AROM to [facility] to increase ability to complete tasks above shoulder height.    Time 12   Period Weeks   OT LONG TERM GOAL #3   Title Patient will increase right shoulder strength to 4/5 to increase ability to return to work and perform required tasks.   Time 12   Period Weeks   Status On-going   OT LONG TERM GOAL #4   Title Patient will decrease pain level to 1/10 or less during daily tasks.   Time 12   Period Weeks   Status On-going   OT LONG TERM GOAL #5   Title Patient will decrease fascial restrictions to min amount.    Time 12   Period Weeks   Status On-going               Plan - 01/05/15 1517    Clinical Impression Statement A: Pt reports that she has slightly less pain although I did palpate 2 small trigger points in right deltoid region. patient was able to complete a combination of AA/ROM and A/ROM exercises supine and standing. VC for form and technique.   Plan P: Attempt all exercises at A/ROM.        Problem List There are no active problems to display for this patient.    Deborah Baker, OTR/L,CBIS  [phone]  01/05/2015, 3:20 PM  [facility] [address] [place], [place], [address] Phone: [phone]   Fax:  [phone]  Name: Deborah Baker MRN: [id number] Date of Birth: 1965/11/18

## 2015-01-08 ENCOUNTER — Ambulatory Visit (HOSPITAL_COMMUNITY): Payer: Worker's Compensation

## 2015-01-08 ENCOUNTER — Encounter (HOSPITAL_COMMUNITY): Payer: Self-pay

## 2015-01-08 DIAGNOSIS — M629 Disorder of muscle, unspecified: Secondary | ICD-10-CM | POA: Diagnosis not present

## 2015-01-08 DIAGNOSIS — M25611 Stiffness of right shoulder, not elsewhere classified: Secondary | ICD-10-CM

## 2015-01-08 DIAGNOSIS — M6289 Other specified disorders of muscle: Secondary | ICD-10-CM

## 2015-01-08 DIAGNOSIS — R29898 Other symptoms and signs involving the musculoskeletal system: Secondary | ICD-10-CM

## 2015-01-08 DIAGNOSIS — M25511 Pain in right shoulder: Secondary | ICD-10-CM

## 2015-01-08 NOTE — Therapy (Signed)
[facility] [address] [place], [place], [address] Phone: [phone]   Fax:  [phone]  Occupational Therapy Treatment  Patient Details  Name: Deborah Baker MRN: [id number] Date of Birth: Jun 19, 1965 Referring Provider: Teryl Lucy, MD  Encounter Date: 01/08/2015      OT End of Session - 01/08/15 1412    Visit Number 24   Number of Visits 36   Date for OT Re-Evaluation 02/02/15   Authorization Type Worker's Comp   OT Start Time 1305   OT Stop Time 1345   OT Time Calculation (min) 40 min   Activity Tolerance Patient tolerated treatment well   Behavior During Therapy [facility] for tasks assessed/performed      Past Medical History  Diagnosis Date  . Hypertension   . Outbursts of anger   . Hyperlipemia     Past Surgical History  Procedure Laterality Date  . Rotator cuff repair      There were no vitals filed for this visit.  Visit Diagnosis:  Tight fascia  Shoulder weakness  Shoulder stiffness, right  Right shoulder pain      Subjective Assessment - 01/08/15 1330    Subjective  S: I saw the Doctor he said that I may never be able to raise my arm up overhead my head completely. He said the pain is probably from nerve damage.    Currently in Pain? Yes   Pain Score 6    Pain Location Shoulder   Pain Orientation Right   Pain Descriptors / Indicators Aching;Throbbing;Tender   Pain Type Acute pain            [facility] OT Assessment - 01/08/15 1333    Assessment   Diagnosis Right rotator cuff repair   Precautions   Precautions Shoulder   Type of Shoulder Precautions 7-8 weeks post op (9/1-9/8): Wean from sling. PROM within tolerance level. Completed AAROM, scapular theraband exercises.   8-9 weeks post op (9/8-9/15): UBE, AROM, ER in sidelying, prone extension, row, horizontal abd. 9-14 weeks post op (9/15-10/20): initiate light stretching, rythmic stabilization, closed chain with wall and floor, plyometrics, cybex  rows/press, general strengthening                  OT Treatments/Exercises (OP) - 01/08/15 1334    Exercises   Exercises Shoulder   Shoulder Exercises: Supine   Protraction PROM;10 reps;AROM;12 reps   Horizontal ABduction PROM;10 reps;AROM;12 reps   External Rotation PROM;10 reps;AROM;12 reps   Internal Rotation PROM;10 reps;AROM;12 reps   Flexion PROM;10 reps;AROM;12 reps   ABduction PROM;10 reps;AROM;12 reps   Shoulder Exercises: Standing   Protraction AROM;10 reps   External Rotation AROM;10 reps   Internal Rotation AROM;10 reps   Flexion AROM;10 reps   ABduction AROM;10 reps   Shoulder Exercises: ROM/Strengthening   Proximal Shoulder Strengthening, Supine 12X with no rest breaks   Manual Therapy   Manual Therapy Myofascial release;Muscle Energy Technique   Manual therapy comments Manual therapy completed prior to exercises.   Myofascial Release Myofascial release to right upper arm, deltoid, trapezius, and scapularis regions to decrease pain and fascial restrictions and increase joint range of motion. Myofascial release to cervical region and sternocleidomastoid region.  Scar massage/release to lateral healed surgical incision this date.     Muscle Energy Technique Muscle energy technique to RUE to decrease muscle spasms and increase range of motion.  OT Short Term Goals - 12/08/14 1549    OT SHORT TERM GOAL #1   Title Patient will be educated and independent with HEP.   Time 6   Period Weeks   Status On-going   OT SHORT TERM GOAL #2   Title Patient will [facility] to [facility] to increase ability to complete dressing tasks.    Time 6   Period Weeks   OT SHORT TERM GOAL #3   Title Patient will increase right shoulder strength to 3/5 to increase ability to hold onto light weight items.    Time 6   Period Weeks   OT SHORT TERM GOAL #4   Title Patient will decrease pain to 3/10 or less during daily tasks.   Time 6   Period Weeks   OT  SHORT TERM GOAL #5   Title Patient will decrease fascial restrictions to mod amount.    Time 6   Period Weeks           OT Long Term Goals - 12/08/14 1549    OT LONG TERM GOAL #1   Title patient will return to highest level of independencen with all daily, leisure, and work related tasks.    Time 12   Period Weeks   Status On-going   OT LONG TERM GOAL #2   Title Patient will increase AROM to [facility] to increase ability to complete tasks above shoulder height.    Time 12   Period Weeks   OT LONG TERM GOAL #3   Title Patient will increase right shoulder strength to 4/5 to increase ability to return to work and perform required tasks.   Time 12   Period Weeks   Status On-going   OT LONG TERM GOAL #4   Title Patient will decrease pain level to 1/10 or less during daily tasks.   Time 12   Period Weeks   Status On-going   OT LONG TERM GOAL #5   Title Patient will decrease fascial restrictions to min amount.    Time 12   Period Weeks   Status On-going               Plan - 01/08/15 1412    Clinical Impression Statement A: Pt reports an increase in pain this session although she was able to achieve greater passive ROM than previously for mini reassessment. Progressed back to A/ROM. Pt experienced pain although was able to complete with VC for form. Increased trigger points palpated in deltoid region.   Plan P: Continue with A/ROM exercises. Slowly progress as patient is able to tolerate.        Problem List There are no active problems to display for this patient.   Limmie PatriciaLaura Augustine Brannick, OTR/L,CBIS  [phone]  01/08/2015, 2:16 PM  [facility] [address] [place], [place], [address] Phone: [phone]   Fax:  [phone]  Name: Deborah Baker MRN: [id number] Date of Birth: Dec 02, 1965

## 2015-01-11 ENCOUNTER — Ambulatory Visit (HOSPITAL_COMMUNITY): Payer: Worker's Compensation

## 2015-01-11 ENCOUNTER — Encounter (HOSPITAL_COMMUNITY): Payer: Self-pay

## 2015-01-11 DIAGNOSIS — M629 Disorder of muscle, unspecified: Secondary | ICD-10-CM

## 2015-01-11 DIAGNOSIS — M6289 Other specified disorders of muscle: Secondary | ICD-10-CM

## 2015-01-11 DIAGNOSIS — M25511 Pain in right shoulder: Secondary | ICD-10-CM

## 2015-01-11 DIAGNOSIS — R29898 Other symptoms and signs involving the musculoskeletal system: Secondary | ICD-10-CM

## 2015-01-11 DIAGNOSIS — M25611 Stiffness of right shoulder, not elsewhere classified: Secondary | ICD-10-CM

## 2015-01-11 NOTE — Therapy (Signed)
[facility] [address] [place], [place], [address] Phone: [phone]   Fax:  [phone]  Occupational Therapy Treatment  Patient Details  Name: Deborah Baker MRN: [id number] Date of Birth: 06-03-1965 Referring Provider: Teryl Lucy, MD  Encounter Date: 01/11/2015      OT End of Session - 01/11/15 1447    Visit Number 25   Number of Visits 36   Date for OT Re-Evaluation 02/02/15   Authorization Type Worker's Comp   OT Start Time 1305   OT Stop Time 1345   OT Time Calculation (min) 40 min   Activity Tolerance Patient tolerated treatment well   Behavior During Therapy [facility] for tasks assessed/performed      Past Medical History  Diagnosis Date  . Hypertension   . Outbursts of anger   . Hyperlipemia     Past Surgical History  Procedure Laterality Date  . Rotator cuff repair      There were no vitals filed for this visit.  Visit Diagnosis:  Tight fascia  Shoulder weakness  Shoulder stiffness, right  Right shoulder pain      Subjective Assessment - 01/11/15 1328    Subjective  S: The top of my shoulder is hot today.    Currently in Pain? Yes   Pain Score 5    Pain Location Shoulder   Pain Orientation Right   Pain Descriptors / Indicators Aching;Throbbing   Pain Type Acute pain            [facility] OT Assessment - 01/11/15 1330    Assessment   Diagnosis Right rotator cuff repair   Precautions   Precautions Shoulder   Type of Shoulder Precautions 7-8 weeks post op (9/1-9/8): Wean from sling. PROM within tolerance level. Completed AAROM, scapular theraband exercises.   8-9 weeks post op (9/8-9/15): UBE, AROM, ER in sidelying, prone extension, row, horizontal abd. 9-14 weeks post op (9/15-10/20): initiate light stretching, rythmic stabilization, closed chain with wall and floor, plyometrics, cybex rows/press, general strengthening                  OT Treatments/Exercises (OP) - 01/11/15 1330    Exercises   Exercises Shoulder   Shoulder Exercises: Supine   Protraction PROM;10 reps;AROM;15 reps   Horizontal ABduction PROM;10 reps;AROM;15 reps   External Rotation PROM;10 reps;Strengthening;15 reps   Internal Rotation PROM;10 reps;AROM;15 reps   Flexion PROM;10 reps;AROM;15 reps   ABduction PROM;10 reps;AROM;15 reps   Shoulder Exercises: Standing   Protraction AROM;12 reps   Horizontal ABduction AROM;12 reps   External Rotation AROM;12 reps   Internal Rotation AROM;12 reps   Flexion AROM;12 reps   ABduction AROM;12 reps   Extension Theraband;12 reps   Theraband Level (Shoulder Extension) Level 2 (Red)   Row Theraband;12 reps   Theraband Level (Shoulder Row) Level 2 (Red)   Retraction Theraband;12 reps   Theraband Level (Shoulder Retraction) Level 2 (Red)   Shoulder Exercises: ROM/Strengthening   Proximal Shoulder Strengthening, Supine 15X no rest breaks   Manual Therapy   Manual Therapy Myofascial release   Manual therapy comments Manual therapy completed prior to exercises.   Myofascial Release Myofascial release to right upper arm, deltoid, trapezius, and scapularis regions to decrease pain and fascial restrictions and increase joint range of motion. Myofascial release to cervical region and sternocleidomastoid region.  Scar massage/release to lateral healed surgical incision this date.  OT Short Term Goals - 12/08/14 1549    OT SHORT TERM GOAL #1   Title Patient will be educated and independent with HEP.   Time 6   Period Weeks   Status On-going   OT SHORT TERM GOAL #2   Title Patient will increase PROM to [facility] to increase ability to complete dressing tasks.    Time 6   Period Weeks   OT SHORT TERM GOAL #3   Title Patient will increase right shoulder strength to 3/5 to increase ability to hold onto light weight items.    Time 6   Period Weeks   OT SHORT TERM GOAL #4   Title Patient will decrease pain to 3/10 or less during daily tasks.    Time 6   Period Weeks   OT SHORT TERM GOAL #5   Title Patient will decrease fascial restrictions to mod amount.    Time 6   Period Weeks           OT Long Term Goals - 12/08/14 1549    OT LONG TERM GOAL #1   Title patient will return to highest level of independencen with all daily, leisure, and work related tasks.    Time 12   Period Weeks   Status On-going   OT LONG TERM GOAL #2   Title Patient will increase AROM to [facility] to increase ability to complete tasks above shoulder height.    Time 12   Period Weeks   OT LONG TERM GOAL #3   Title Patient will increase right shoulder strength to 4/5 to increase ability to return to work and perform required tasks.   Time 12   Period Weeks   Status On-going   OT LONG TERM GOAL #4   Title Patient will decrease pain level to 1/10 or less during daily tasks.   Time 12   Period Weeks   Status On-going   OT LONG TERM GOAL #5   Title Patient will decrease fascial restrictions to min amount.    Time 12   Period Weeks   Status On-going               Plan - 01/11/15 1447    Clinical Impression Statement A: Increased repetitons for supine and standing A/ROM to 15. Pt tolerated well with some pain noted during abduction supine and standing. Unable to achieve full ROM during abduction.    Plan P: Attempt to progress to 1# hand weights if able to tolerate. Add UBE. Schedule more appts.         Problem List There are no active problems to display for this patient.  Limmie PatriciaLaura Rhya Shan, OTR/L,CBIS  [phone]  01/11/2015, 2:57 PM  [facility] [address] [place], [place], [address] Phone: [phone]   Fax:  [phone]  Name: Deborah Baker MRN: [id number] Date of Birth: 08/01/1965

## 2015-01-15 ENCOUNTER — Ambulatory Visit (HOSPITAL_COMMUNITY): Payer: Worker's Compensation | Admitting: Occupational Therapy

## 2015-01-15 ENCOUNTER — Encounter (HOSPITAL_COMMUNITY): Payer: Self-pay | Admitting: Occupational Therapy

## 2015-01-15 DIAGNOSIS — M25611 Stiffness of right shoulder, not elsewhere classified: Secondary | ICD-10-CM

## 2015-01-15 DIAGNOSIS — M25511 Pain in right shoulder: Secondary | ICD-10-CM

## 2015-01-15 DIAGNOSIS — M629 Disorder of muscle, unspecified: Secondary | ICD-10-CM

## 2015-01-15 DIAGNOSIS — M6289 Other specified disorders of muscle: Secondary | ICD-10-CM

## 2015-01-15 DIAGNOSIS — R29898 Other symptoms and signs involving the musculoskeletal system: Secondary | ICD-10-CM

## 2015-01-15 NOTE — Therapy (Signed)
[facility] [address] [place], [place], [address] Phone: [phone]   Fax:  [phone]  Occupational Therapy Treatment  Patient Details  Name: Deborah Baker MRN: [id number] Date of Birth: [birth year] Referring Provider: Teryl Lucy, MD  Encounter Date: 01/15/2015      OT End of Session - 01/15/15 1347    Visit Number 26   Number of Visits 36   Date for OT Re-Evaluation 02/02/15   Authorization Type Worker's Comp   OT Start Time 1304   OT Stop Time 1349   OT Time Calculation (min) 45 min   Activity Tolerance Patient tolerated treatment well   Behavior During Therapy [facility] for tasks assessed/performed      Past Medical History  Diagnosis Date  . Hypertension   . Outbursts of anger   . Hyperlipemia     Past Surgical History  Procedure Laterality Date  . Rotator cuff repair      There were no vitals filed for this visit.  Visit Diagnosis:  Tight fascia  Shoulder weakness  Shoulder stiffness, right  Right shoulder pain      Subjective Assessment - 01/15/15 1304    Subjective  S: It's feeling pretty good today, it's just stiff.    Currently in Pain? Yes   Pain Score 2    Pain Location Shoulder   Pain Orientation Right   Pain Descriptors / Indicators Aching   Pain Type Acute pain            [facility] OT Assessment - 01/15/15 1343    Assessment   Diagnosis Right rotator cuff repair   Precautions   Precautions Shoulder   Type of Shoulder Precautions 7-8 weeks post op (9/1-9/8): Wean from sling. PROM within tolerance level. Completed AAROM, scapular theraband exercises.   8-9 weeks post op (9/8-9/15): UBE, AROM, ER in sidelying, prone extension, row, horizontal abd. 9-14 weeks post op (9/15-10/20): initiate light stretching, rythmic stabilization, closed chain with wall and floor, plyometrics, cybex rows/press, general strengthening                  OT Treatments/Exercises (OP) - 01/15/15 1306     Exercises   Exercises Shoulder   Shoulder Exercises: Supine   Protraction PROM;5 reps;Strengthening;10 reps   Protraction Weight (lbs) 1   Horizontal ABduction PROM;5 reps;Strengthening;10 reps   Horizontal ABduction Weight (lbs) 1   External Rotation PROM;5 reps;Strengthening;10 reps   External Rotation Weight (lbs) 1   Internal Rotation PROM;5 reps;Strengthening;10 reps   Internal Rotation Weight (lbs) 1   Flexion PROM;5 reps;Strengthening;10 reps   Shoulder Flexion Weight (lbs) 1   ABduction PROM;5 reps;Strengthening;10 reps   Shoulder ABduction Weight (lbs) 1   Shoulder Exercises: Standing   Protraction AROM;12 reps   Horizontal ABduction AROM;10 reps   External Rotation AROM;12 reps   Internal Rotation AROM;12 reps   Flexion AROM;12 reps   ABduction AROM;10 reps   Shoulder Exercises: ROM/Strengthening   UBE (Upper Arm Bike) Level 1 3' forward 3' reverse   Manual Therapy   Manual Therapy Myofascial release   Manual therapy comments Manual therapy completed prior to exercises.   Myofascial Release Myofascial release to right upper arm, deltoid, trapezius, and scapularis regions to decrease pain and fascial restrictions and increase joint range of motion. Myofascial release to cervical region and sternocleidomastoid region.  Scar massage/release to lateral healed surgical incision this date.  OT Short Term Goals - 12/08/14 1549    OT SHORT TERM GOAL #1   Title Patient will be educated and independent with HEP.   Time 6   Period Weeks   Status On-going   OT SHORT TERM GOAL #2   Title Patient will increase PROM to [facility] to increase ability to complete dressing tasks.    Time 6   Period Weeks   OT SHORT TERM GOAL #3   Title Patient will increase right shoulder strength to 3/5 to increase ability to hold onto light weight items.    Time 6   Period Weeks   OT SHORT TERM GOAL #4   Title Patient will decrease pain to 3/10 or less during daily tasks.    Time 6   Period Weeks   OT SHORT TERM GOAL #5   Title Patient will decrease fascial restrictions to mod amount.    Time 6   Period Weeks           OT Long Term Goals - 12/08/14 1549    OT LONG TERM GOAL #1   Title patient will return to highest level of independencen with all daily, leisure, and work related tasks.    Time 12   Period Weeks   Status On-going   OT LONG TERM GOAL #2   Title Patient will increase AROM to [facility] to increase ability to complete tasks above shoulder height.    Time 12   Period Weeks   OT LONG TERM GOAL #3   Title Patient will increase right shoulder strength to 4/5 to increase ability to return to work and perform required tasks.   Time 12   Period Weeks   Status On-going   OT LONG TERM GOAL #4   Title Patient will decrease pain level to 1/10 or less during daily tasks.   Time 12   Period Weeks   Status On-going   OT LONG TERM GOAL #5   Title Patient will decrease fascial restrictions to min amount.    Time 12   Period Weeks   Status On-going               Plan - 01/15/15 1344    Clinical Impression Statement A: Added 1# weight to supine exercises. Added UBE. Pt with increased tightness at lower trapezius and scapularis regions. Pt required increased time and rest breaks for completion of exercises. Unable to achieve full ROM during flexion & abduction.    Plan P: Resume proximal shoulder strengthening, attempt to complete 12 repetitions in standing.         Problem List There are no active problems to display for this patient.   Ezra SitesLeslie Sharia Averitt, OTR/L  [phone]  01/15/2015, 1:50 PM  [facility] [address] [place], [place], [address] Phone: [phone]   Fax:  [phone]  Name: Deborah Baker MRN: [id number] Date of Birth: 12-20-1965

## 2015-01-21 ENCOUNTER — Ambulatory Visit (HOSPITAL_COMMUNITY): Payer: Worker's Compensation | Admitting: Occupational Therapy

## 2015-01-21 ENCOUNTER — Encounter (HOSPITAL_COMMUNITY): Payer: Self-pay | Admitting: Occupational Therapy

## 2015-01-21 DIAGNOSIS — R29898 Other symptoms and signs involving the musculoskeletal system: Secondary | ICD-10-CM

## 2015-01-21 DIAGNOSIS — M629 Disorder of muscle, unspecified: Secondary | ICD-10-CM | POA: Diagnosis not present

## 2015-01-21 DIAGNOSIS — M25511 Pain in right shoulder: Secondary | ICD-10-CM

## 2015-01-21 DIAGNOSIS — M6289 Other specified disorders of muscle: Secondary | ICD-10-CM

## 2015-01-21 DIAGNOSIS — M25611 Stiffness of right shoulder, not elsewhere classified: Secondary | ICD-10-CM

## 2015-01-21 NOTE — Therapy (Signed)
[facility] [address] [place], [place], [address] Phone: [phone]   Fax:  [phone]  Occupational Therapy Treatment  Patient Details  Name: Deborah Baker MRN: [id number] Date of Birth: 05-Feb-1965 Referring Provider: Teryl Lucy, MD  Encounter Date: 01/21/2015      OT End of Session - 01/21/15 1154    Visit Number 27   Number of Visits 36   Date for OT Re-Evaluation 02/02/15   Authorization Type Worker's Comp   OT Start Time 0933   OT Stop Time 1017   OT Time Calculation (min) 44 min   Activity Tolerance Patient tolerated treatment well   Behavior During Therapy [facility] for tasks assessed/performed      Past Medical History  Diagnosis Date  . Hypertension   . Outbursts of anger   . Hyperlipemia     Past Surgical History  Procedure Laterality Date  . Rotator cuff repair      There were no vitals filed for this visit.  Visit Diagnosis:  Tight fascia  Shoulder weakness  Shoulder stiffness, right  Right shoulder pain      Subjective Assessment - 01/21/15 0934    Subjective  S: It's not too bad today, not too tight feeling.    Currently in Pain? Yes   Pain Score 1    Pain Location Shoulder   Pain Orientation Right   Pain Descriptors / Indicators Aching   Pain Type Acute pain            [facility] OT Assessment - 01/21/15 0933    Assessment   Diagnosis Right rotator cuff repair   Precautions   Precautions Shoulder   Type of Shoulder Precautions 7-8 weeks post op (9/1-9/8): Wean from sling. PROM within tolerance level. Completed AAROM, scapular theraband exercises.   8-9 weeks post op (9/8-9/15): UBE, AROM, ER in sidelying, prone extension, row, horizontal abd. 9-14 weeks post op (9/15-10/20): initiate light stretching, rythmic stabilization, closed chain with wall and floor, plyometrics, cybex rows/press, general strengthening                  OT Treatments/Exercises (OP) - 01/21/15 0935     Exercises   Exercises Shoulder   Shoulder Exercises: Supine   Protraction PROM;5 reps;Strengthening;12 reps   Protraction Weight (lbs) 1   Horizontal ABduction PROM;5 reps;Strengthening;12 reps   Horizontal ABduction Weight (lbs) 1   External Rotation PROM;5 reps;Strengthening;12 reps   External Rotation Weight (lbs) 1   Internal Rotation PROM;5 reps;Strengthening;12 reps   Internal Rotation Weight (lbs) 1   Flexion PROM;5 reps;Strengthening;12 reps   Shoulder Flexion Weight (lbs) 1   ABduction PROM;5 reps;Strengthening;12 reps   Shoulder ABduction Weight (lbs) 1   Shoulder Exercises: Standing   Protraction AROM;12 reps   Horizontal ABduction AROM;12 reps   External Rotation AROM;12 reps   Internal Rotation AROM;12 reps   Flexion AROM;12 reps   ABduction AROM;12 reps   Extension Theraband;12 reps   Theraband Level (Shoulder Extension) Level 2 (Red)   Row Theraband;12 reps   Theraband Level (Shoulder Row) Level 2 (Red)   Retraction Theraband;12 reps   Theraband Level (Shoulder Retraction) Level 2 (Red)   Shoulder Exercises: ROM/Strengthening   X to V Arms 10X each   Proximal Shoulder Strengthening, Supine 10X each 1# weight 1 rest break   Proximal Shoulder Strengthening, Seated 10X each no rest breaks   Ball on Wall --   Manual Therapy   Manual Therapy Myofascial release  Manual therapy comments Manual therapy completed prior to exercises.   Myofascial Release Myofascial release to right upper arm, deltoid, trapezius, and scapularis regions to decrease pain and fascial restrictions and increase joint range of motion. Myofascial release to cervical region and sternocleidomastoid region.  Scar massage/release to lateral healed surgical incision this date.                    OT Short Term Goals - 12/08/14 1549    OT SHORT TERM GOAL #1   Title Patient will be educated and independent with HEP.   Time 6   Period Weeks   Status On-going   OT SHORT TERM GOAL #2    Title Patient will increase PROM to [facility] to increase ability to complete dressing tasks.    Time 6   Period Weeks   OT SHORT TERM GOAL #3   Title Patient will increase right shoulder strength to 3/5 to increase ability to hold onto light weight items.    Time 6   Period Weeks   OT SHORT TERM GOAL #4   Title Patient will decrease pain to 3/10 or less during daily tasks.   Time 6   Period Weeks   OT SHORT TERM GOAL #5   Title Patient will decrease fascial restrictions to mod amount.    Time 6   Period Weeks           OT Long Term Goals - 12/08/14 1549    OT LONG TERM GOAL #1   Title patient will return to highest level of independencen with all daily, leisure, and work related tasks.    Time 12   Period Weeks   Status On-going   OT LONG TERM GOAL #2   Title Patient will increase AROM to [facility] to increase ability to complete tasks above shoulder height.    Time 12   Period Weeks   OT LONG TERM GOAL #3   Title Patient will increase right shoulder strength to 4/5 to increase ability to return to work and perform required tasks.   Time 12   Period Weeks   Status On-going   OT LONG TERM GOAL #4   Title Patient will decrease pain level to 1/10 or less during daily tasks.   Time 12   Period Weeks   Status On-going   OT LONG TERM GOAL #5   Title Patient will decrease fascial restrictions to min amount.    Time 12   Period Weeks   Status On-going               Plan - 01/21/15 1154    Clinical Impression Statement A: Added 1# weight to proximal shoulder strengthening, resumed red theraband. Pt continues to have increased pain and tenderness at anterior shoulder region during manual therapy. Pt required intermittent rest breaks during session.    Plan P: Attempt to add 1# weight in standing, overhead lacing        Problem List There are no active problems to display for this patient.   Ezra SitesLeslie Alfonso Shackett, OTR/L  [phone]  01/21/2015, 11:58 AM  [facility] [address] [place], [place], [address] Phone: [phone]   Fax:  [phone]  Name: Deborah Baker MRN: [id number] Date of Birth: [birth year]

## 2015-01-22 ENCOUNTER — Encounter (HOSPITAL_COMMUNITY): Payer: Self-pay

## 2015-01-22 ENCOUNTER — Ambulatory Visit (HOSPITAL_COMMUNITY): Payer: Worker's Compensation

## 2015-01-22 DIAGNOSIS — M629 Disorder of muscle, unspecified: Secondary | ICD-10-CM | POA: Diagnosis not present

## 2015-01-22 DIAGNOSIS — R29898 Other symptoms and signs involving the musculoskeletal system: Secondary | ICD-10-CM

## 2015-01-22 DIAGNOSIS — M25611 Stiffness of right shoulder, not elsewhere classified: Secondary | ICD-10-CM

## 2015-01-22 DIAGNOSIS — M25511 Pain in right shoulder: Secondary | ICD-10-CM

## 2015-01-22 DIAGNOSIS — M6289 Other specified disorders of muscle: Secondary | ICD-10-CM

## 2015-01-22 NOTE — Therapy (Signed)
[facility] [address] [place], [place], [address] Phone: [phone]   Fax:  [phone]  Occupational Therapy Treatment  Patient Details  Name: Deborah Baker MRN: [id number] Date of Birth: [birth year] Referring Provider: Teryl Lucy, MD  Encounter Date: 01/22/2015      OT End of Session - 01/22/15 1128    Visit Number 28   Number of Visits 36   Date for OT Re-Evaluation 02/02/15   Authorization Type Worker's Comp   OT Start Time 1020   OT Stop Time 1100   OT Time Calculation (min) 40 min   Activity Tolerance Patient tolerated treatment well   Behavior During Therapy [facility] for tasks assessed/performed      Past Medical History  Diagnosis Date  . Hypertension   . Outbursts of anger   . Hyperlipemia     Past Surgical History  Procedure Laterality Date  . Rotator cuff repair      There were no vitals filed for this visit.  Visit Diagnosis:  Tight fascia  Shoulder weakness  Shoulder stiffness, right  Right shoulder pain      Subjective Assessment - 01/22/15 1039    Subjective  S: My arm is just nagging.    Currently in Pain? Yes   Pain Score 2    Pain Location Shoulder   Pain Orientation Right   Pain Descriptors / Indicators Aching   Pain Type Acute pain            [facility] OT Assessment - 01/22/15 1040    Assessment   Diagnosis Right rotator cuff repair   Precautions   Precautions Shoulder   Type of Shoulder Precautions 7-8 weeks post op (9/1-9/8): Wean from sling. PROM within tolerance level. Completed AAROM, scapular theraband exercises.   8-9 weeks post op (9/8-9/15): UBE, AROM, ER in sidelying, prone extension, row, horizontal abd. 9-14 weeks post op (9/15-10/20): initiate light stretching, rythmic stabilization, closed chain with wall and floor, plyometrics, cybex rows/press, general strengthening                  OT Treatments/Exercises (OP) - 01/22/15 1041    Exercises   Exercises  Shoulder   Shoulder Exercises: Supine   Protraction PROM;5 reps;Strengthening;12 reps   Protraction Weight (lbs) 1   Horizontal ABduction PROM;5 reps;Strengthening;12 reps   Horizontal ABduction Weight (lbs) 1   External Rotation PROM;5 reps;Strengthening;12 reps   External Rotation Weight (lbs) 1   Internal Rotation PROM;5 reps;Strengthening;12 reps   Internal Rotation Weight (lbs) 1   Flexion PROM;5 reps;Strengthening;12 reps   Shoulder Flexion Weight (lbs) 1   ABduction PROM;5 reps;Strengthening;12 reps   Shoulder ABduction Weight (lbs) 1   Shoulder Exercises: Standing   Protraction Strengthening;10 reps   Protraction Weight (lbs) 1   Horizontal ABduction Strengthening;10 reps   Horizontal ABduction Weight (lbs) 1   External Rotation Strengthening;10 reps   External Rotation Weight (lbs) 1   Internal Rotation Strengthening;10 reps   Internal Rotation Weight (lbs) 1   Flexion Strengthening;10 reps   Shoulder Flexion Weight (lbs) 1   ABduction Strengthening;10 reps   Shoulder ABduction Weight (lbs) 1   Shoulder Exercises: ROM/Strengthening   Proximal Shoulder Strengthening, Supine 10X each 1# weight 1 rest break   Proximal Shoulder Strengthening, Seated 10X each  with 1#no rest breaks   Manual Therapy   Manual Therapy Myofascial release   Manual therapy comments Manual therapy completed prior to exercises.   Myofascial Release Myofascial release to  right upper arm, deltoid, trapezius, and scapularis regions to decrease pain and fascial restrictions and increase joint range of motion. Myofascial release to cervical region and sternocleidomastoid region.  Scar massage/release to lateral healed surgical incision this date.                    OT Short Term Goals - 12/08/14 1549    OT SHORT TERM GOAL #1   Title Patient will be educated and independent with HEP.   Time 6   Period Weeks   Status On-going   OT SHORT TERM GOAL #2   Title Patient will [facility] to [facility]  to increase ability to complete dressing tasks.    Time 6   Period Weeks   OT SHORT TERM GOAL #3   Title Patient will increase right shoulder strength to 3/5 to increase ability to hold onto light weight items.    Time 6   Period Weeks   OT SHORT TERM GOAL #4   Title Patient will decrease pain to 3/10 or less during daily tasks.   Time 6   Period Weeks   OT SHORT TERM GOAL #5   Title Patient will decrease fascial restrictions to mod amount.    Time 6   Period Weeks           OT Long Term Goals - 12/08/14 1549    OT LONG TERM GOAL #1   Title patient will return to highest level of independencen with all daily, leisure, and work related tasks.    Time 12   Period Weeks   Status On-going   OT LONG TERM GOAL #2   Title Patient will increase AROM to [facility] to increase ability to complete tasks above shoulder height.    Time 12   Period Weeks   OT LONG TERM GOAL #3   Title Patient will increase right shoulder strength to 4/5 to increase ability to return to work and perform required tasks.   Time 12   Period Weeks   Status On-going   OT LONG TERM GOAL #4   Title Patient will decrease pain level to 1/10 or less during daily tasks.   Time 12   Period Weeks   Status On-going   OT LONG TERM GOAL #5   Title Patient will decrease fascial restrictions to min amount.    Time 12   Period Weeks   Status On-going               Plan - 01/22/15 1128    Clinical Impression Statement A: Patient was able to complete strengthening standing exercises using 1# with increased time. Min VC for form and technique.    Plan P: Add overhead lacing.        Problem List There are no active problems to display for this patient.   Limmie PatriciaLaura Juda Lajeunesse, OTR/L,CBIS  [phone]  01/22/2015, 11:32 AM  [facility] [address] [place], [place], [address] Phone: [phone]   Fax:  [phone]  Name: Deborah Baker MRN: [id number] Date of  Birth: 01-Nov-1965

## 2015-01-28 ENCOUNTER — Encounter (HOSPITAL_COMMUNITY): Payer: Self-pay

## 2015-01-28 ENCOUNTER — Ambulatory Visit (HOSPITAL_COMMUNITY): Payer: Worker's Compensation

## 2015-01-28 DIAGNOSIS — M629 Disorder of muscle, unspecified: Secondary | ICD-10-CM

## 2015-01-28 DIAGNOSIS — M25511 Pain in right shoulder: Secondary | ICD-10-CM

## 2015-01-28 DIAGNOSIS — M6289 Other specified disorders of muscle: Secondary | ICD-10-CM

## 2015-01-28 DIAGNOSIS — M25611 Stiffness of right shoulder, not elsewhere classified: Secondary | ICD-10-CM

## 2015-01-28 DIAGNOSIS — R29898 Other symptoms and signs involving the musculoskeletal system: Secondary | ICD-10-CM

## 2015-01-28 NOTE — Therapy (Signed)
[facility] [address] [place], [place], [address] Phone: [phone]   Fax:  [phone]  Occupational Therapy Treatment  Patient Details  Name: Deborah Baker MRN: [id number] Date of Birth: 10/14/1965 Referring Provider: Teryl Lucy, MD  Encounter Date: 01/28/2015      OT End of Session - 01/28/15 1148    Visit Number 29   Number of Visits 36   Date for OT Re-Evaluation 02/02/15   Authorization Type Worker's Comp   OT Start Time 1106   OT Stop Time 1145   OT Time Calculation (min) 39 min   Activity Tolerance Patient tolerated treatment well   Behavior During Therapy [facility] for tasks assessed/performed      Past Medical History  Diagnosis Date  . Hypertension   . Outbursts of anger   . Hyperlipemia     Past Surgical History  Procedure Laterality Date  . Rotator cuff repair      There were no vitals filed for this visit.  Visit Diagnosis:  Tight fascia  Shoulder weakness  Shoulder stiffness, right  Right shoulder pain      Subjective Assessment - 01/28/15 1127    Subjective  S: I just feel real stiff today.   Currently in Pain? No/denies            [facility] OT Assessment - 01/28/15 1127    Assessment   Diagnosis Right rotator cuff repair   Precautions   Precautions Shoulder   Type of Shoulder Precautions 7-8 weeks post op (9/1-9/8): Wean from sling. PROM within tolerance level. Completed AAROM, scapular theraband exercises.   8-9 weeks post op (9/8-9/15): UBE, AROM, ER in sidelying, prone extension, row, horizontal abd. 9-14 weeks post op (9/15-10/20): initiate light stretching, rythmic stabilization, closed chain with wall and floor, plyometrics, cybex rows/press, general strengthening                  OT Treatments/Exercises (OP) - 01/28/15 1127    Exercises   Exercises Shoulder   Shoulder Exercises: Supine   Protraction PROM;5 reps;Strengthening;15 reps   Protraction Weight (lbs) 1   Horizontal ABduction PROM;5 reps;Strengthening;15 reps   Horizontal ABduction Weight (lbs) 1   External Rotation PROM;5 reps;Strengthening;15 reps   External Rotation Weight (lbs) 1   Internal Rotation PROM;5 reps;Strengthening;15 reps   Internal Rotation Weight (lbs) 1   Flexion PROM;5 reps;Strengthening;15 reps   Shoulder Flexion Weight (lbs) 1   ABduction PROM;5 reps;Strengthening;15 reps   Shoulder ABduction Weight (lbs) 1   Shoulder Exercises: Standing   Protraction Strengthening;15 reps   Protraction Weight (lbs) 1   Horizontal ABduction Strengthening;15 reps   Horizontal ABduction Weight (lbs) 1   External Rotation Strengthening;15 reps   External Rotation Weight (lbs) 1   Internal Rotation Strengthening;15 reps   Internal Rotation Weight (lbs) 1   Flexion Strengthening;15 reps   Shoulder Flexion Weight (lbs) 1   ABduction Strengthening;15 reps   Shoulder ABduction Weight (lbs) 1   Shoulder Exercises: ROM/Strengthening   UBE (Upper Arm Bike) Level 1 3' forward 3' reverse   Over Head Lace 2'   X to V Arms 15X with 1#   Proximal Shoulder Strengthening, Supine 15X each 1# weight 1 rest break   Proximal Shoulder Strengthening, Seated 15X each  with 1# no rest breaks   Manual Therapy   Manual Therapy Myofascial release;Muscle Energy Technique   Manual therapy comments Manual therapy completed prior to exercises.   Myofascial Release Myofascial release to  right upper arm, deltoid, trapezius, and scapularis regions to decrease pain and fascial restrictions and increase joint range of motion. Myofascial release to cervical region and sternocleidomastoid region.  Scar massage/release to lateral healed surgical incision this date.     Muscle Energy Technique Muscle energy technique to RUE to decrease muscle spasms and increase range of motion.                  OT Short Term Goals - 12/08/14 1549    OT SHORT TERM GOAL #1   Title Patient will be educated and independent  with HEP.   Time 6   Period Weeks   Status On-going   OT SHORT TERM GOAL #2   Title Patient will increase PROM to [facility] to increase ability to complete dressing tasks.    Time 6   Period Weeks   OT SHORT TERM GOAL #3   Title Patient will increase right shoulder strength to 3/5 to increase ability to hold onto light weight items.    Time 6   Period Weeks   OT SHORT TERM GOAL #4   Title Patient will decrease pain to 3/10 or less during daily tasks.   Time 6   Period Weeks   OT SHORT TERM GOAL #5   Title Patient will decrease fascial restrictions to mod amount.    Time 6   Period Weeks           OT Long Term Goals - 12/08/14 1549    OT LONG TERM GOAL #1   Title patient will return to highest level of independencen with all daily, leisure, and work related tasks.    Time 12   Period Weeks   Status On-going   OT LONG TERM GOAL #2   Title Patient will increase AROM to [facility] to increase ability to complete tasks above shoulder height.    Time 12   Period Weeks   OT LONG TERM GOAL #3   Title Patient will increase right shoulder strength to 4/5 to increase ability to return to work and perform required tasks.   Time 12   Period Weeks   Status On-going   OT LONG TERM GOAL #4   Title Patient will decrease pain level to 1/10 or less during daily tasks.   Time 12   Period Weeks   Status On-going   OT LONG TERM GOAL #5   Title Patient will decrease fascial restrictions to min amount.    Time 12   Period Weeks   Status On-going               Plan - 01/28/15 1148    Clinical Impression Statement A: Added overhead lacing and increased strengthening repetitions for standing and supine. Patient require min VC for form and technique.    Plan P: Add prone exercises.        Problem List There are no active problems to display for this patient.   Deborah Baker, OTR/L,CBIS  [phone]  01/28/2015, 11:53 AM  [facility] [address] [place], [place], [address] Phone: [phone]   Fax:  [phone]  Name: Deborah Baker MRN: [id number] Date of Birth: [birth year]

## 2015-01-29 ENCOUNTER — Encounter (HOSPITAL_COMMUNITY): Payer: Self-pay

## 2015-01-29 ENCOUNTER — Ambulatory Visit (HOSPITAL_COMMUNITY): Payer: Worker's Compensation

## 2015-01-29 DIAGNOSIS — R29898 Other symptoms and signs involving the musculoskeletal system: Secondary | ICD-10-CM

## 2015-01-29 DIAGNOSIS — M629 Disorder of muscle, unspecified: Secondary | ICD-10-CM

## 2015-01-29 DIAGNOSIS — M6289 Other specified disorders of muscle: Secondary | ICD-10-CM

## 2015-01-29 DIAGNOSIS — M25611 Stiffness of right shoulder, not elsewhere classified: Secondary | ICD-10-CM

## 2015-01-29 DIAGNOSIS — M25511 Pain in right shoulder: Secondary | ICD-10-CM

## 2015-01-29 NOTE — Therapy (Signed)
[facility] [address] [place], [place], [address] Phone: [phone]   Fax:  [phone]  Occupational Therapy Treatment  Patient Details  Name: Deborah Baker MRN: [id number] Date of Birth: 1966/01/11 Referring Provider: Teryl LucyJoshua Landau, MD  Encounter Date: 01/29/2015      OT End of Session - 01/29/15 1616    Visit Number 30   Number of Visits 36   Date for OT Re-Evaluation 02/02/15   Authorization Type Worker's Comp   OT Start Time 1430   OT Stop Time 1515   OT Time Calculation (min) 45 min   Activity Tolerance Patient tolerated treatment well   Behavior During Therapy [facility] for tasks assessed/performed      Past Medical History  Diagnosis Date  . Hypertension   . Outbursts of anger   . Hyperlipemia     Past Surgical History  Procedure Laterality Date  . Rotator cuff repair      There were no vitals filed for this visit.  Visit Diagnosis:  Tight fascia  Shoulder weakness  Right shoulder pain  Shoulder stiffness, right      Subjective Assessment - 01/28/15 1127    Subjective  S: I just feel real stiff today.   Currently in Pain? No/denies            [facility] - 01/29/15 1452    Assessment   Diagnosis Right rotator cuff repair   Precautions   Precautions Shoulder   Type of Shoulder Precautions 7-8 weeks post op (9/1-9/8): Wean from sling. PROM within tolerance level. Completed AAROM, scapular theraband exercises.   8-9 weeks post op (9/8-9/15): UBE, AROM, ER in sidelying, prone extension, row, horizontal abd. 9-14 weeks post op (9/15-10/20): initiate light stretching, rythmic stabilization, closed chain with wall and floor, plyometrics, cybex rows/press, general strengthening                  OT Treatments/Exercises (OP) - 01/29/15 1453    Exercises   Exercises Shoulder   Shoulder Exercises: Supine   Protraction PROM;5 reps;Strengthening;15 reps   Protraction Weight (lbs) 1   Horizontal ABduction PROM;5 reps;Strengthening;15 reps   Horizontal ABduction Weight (lbs) 1   External Rotation PROM;5 reps;Strengthening;15 reps   External Rotation Weight (lbs) 1   Internal Rotation PROM;5 reps;Strengthening;15 reps   Internal Rotation Weight (lbs) 1   Flexion PROM;5 reps;Strengthening;15 reps   Shoulder Flexion Weight (lbs) 1   ABduction PROM;5 reps;Strengthening;15 reps   Shoulder ABduction Weight (lbs) 1   Shoulder Exercises: Prone   Retraction Strengthening;12 reps   Retraction Weight (lbs) 1   Other Prone Exercises [place] exercises 12X   Other Prone Exercises Scapular raises; 12X   Shoulder Exercises: Standing   Extension Theraband;15 reps   Theraband Level (Shoulder Extension) Level 2 (Red)   Row Theraband;15 reps   Theraband Level (Shoulder Row) Level 2 (Red)   Retraction Theraband;15 reps   Theraband Level (Shoulder Retraction) Level 2 (Red)   Shoulder Exercises: ROM/Strengthening   X to V Arms 15X with 1#   Manual Therapy   Manual Therapy Myofascial release   Manual therapy comments Manual therapy completed prior to exercises.   Myofascial Release Myofascial release to right upper arm, deltoid, trapezius, and scapularis regions to decrease pain and fascial restrictions and increase joint range of motion. Myofascial release to cervical region and sternocleidomastoid region.  Scar massage/release to lateral healed surgical incision this date.  OT Short Term Goals - 12/08/14 1549    OT SHORT TERM GOAL #1   Title Patient will be educated and independent with HEP.   Time 6   Period Weeks   Status On-going   OT SHORT TERM GOAL #2   Title Patient will increase PROM to [facility] to increase ability to complete dressing tasks.    Time 6   Period Weeks   OT SHORT TERM GOAL #3   Title Patient will increase right shoulder strength to 3/5 to increase ability to hold onto light weight items.    Time 6   Period Weeks   OT SHORT TERM  GOAL #4   Title Patient will decrease pain to 3/10 or less during daily tasks.   Time 6   Period Weeks   OT SHORT TERM GOAL #5   Title Patient will decrease fascial restrictions to mod amount.    Time 6   Period Weeks           OT Long Term Goals - 12/08/14 1549    OT LONG TERM GOAL #1   Title patient will return to highest level of independencen with all daily, leisure, and work related tasks.    Time 12   Period Weeks   Status On-going   OT LONG TERM GOAL #2   Title Patient will increase AROM to [facility] to increase ability to complete tasks above shoulder height.    Time 12   Period Weeks   OT LONG TERM GOAL #3   Title Patient will increase right shoulder strength to 4/5 to increase ability to return to work and perform required tasks.   Time 12   Period Weeks   Status On-going   OT LONG TERM GOAL #4   Title Patient will decrease pain level to 1/10 or less during daily tasks.   Time 12   Period Weeks   Status On-going   OT LONG TERM GOAL #5   Title Patient will decrease fascial restrictions to min amount.    Time 12   Period Weeks   Status On-going               Plan - 01/29/15 1617    Clinical Impression Statement A: Added prone exercises and patietn tolerated with min VC for technique and form.    Plan P: Add ball on the wall and attempt 2# weight supine. Measure for MD appt.         Problem List There are no active problems to display for this patient.   Limmie Patricia, OTR/L,CBIS  [phone]  01/29/2015, 4:21 PM  [facility] [address] [place], [place], [address] Phone: [phone]   Fax:  [phone]  Name: Deborah Baker MRN: [id number] Date of Birth: 04-08-1965

## 2015-02-02 ENCOUNTER — Encounter (HOSPITAL_COMMUNITY): Payer: Self-pay

## 2015-02-02 ENCOUNTER — Ambulatory Visit (HOSPITAL_COMMUNITY): Payer: Worker's Compensation | Attending: Orthopedic Surgery

## 2015-02-02 DIAGNOSIS — M25511 Pain in right shoulder: Secondary | ICD-10-CM | POA: Diagnosis present

## 2015-02-02 DIAGNOSIS — Z9889 Other specified postprocedural states: Secondary | ICD-10-CM | POA: Diagnosis present

## 2015-02-02 DIAGNOSIS — R29898 Other symptoms and signs involving the musculoskeletal system: Secondary | ICD-10-CM | POA: Diagnosis not present

## 2015-02-02 DIAGNOSIS — M25611 Stiffness of right shoulder, not elsewhere classified: Secondary | ICD-10-CM | POA: Insufficient documentation

## 2015-02-02 DIAGNOSIS — M629 Disorder of muscle, unspecified: Secondary | ICD-10-CM | POA: Insufficient documentation

## 2015-02-02 NOTE — Therapy (Signed)
[place], [place], [address] Phone: [phone]   Fax:  [phone]  Occupational Therapy Treatment  Patient Details  Name: Deborah Baker MRN: [id number] Date of Birth: 18-May-1965 Referring Provider: Marchia Bond, MD  Encounter Date: 02/02/2015      OT End of Session - 02/02/15 1737    Visit Number 31   Number of Visits 36   Date for OT Re-Evaluation 02/02/15   Authorization Type Worker's Comp   OT Start Time 1655  reassessment   OT Stop Time 1735   OT Time Calculation (min) 40 min   Activity Tolerance Patient tolerated treatment well   Behavior During Therapy [facility] for tasks assessed/performed      Past Medical History  Diagnosis Date  . Hypertension   . Outbursts of anger   . Hyperlipemia     Past Surgical History  Procedure Laterality Date  . Rotator cuff repair      There were no vitals filed for this visit.  Visit Diagnosis:  Shoulder weakness  Shoulder stiffness, right      Subjective Assessment - 02/02/15 1712    Subjective  S: My strength sucks.   Currently in Pain? No/denies            [facility] OT Assessment - 02/02/15 1656    Assessment   Diagnosis Right rotator cuff repair   Precautions   Precautions Shoulder   Type of Shoulder Precautions 7-8 weeks post op (9/1-9/8): Wean from sling. PROM within tolerance level. Completed AAROM, scapular theraband exercises.   8-9 weeks post op (9/8-9/15): UBE, AROM, ER in sidelying, prone extension, row, horizontal abd. 9-14 weeks post op (9/15-10/20): initiate light stretching, rythmic stabilization, closed chain with wall and floor, plyometrics, cybex rows/press, general strengthening   AROM   Overall AROM Comments Assessed seated. IR/er adducted   AROM Assessment Site Shoulder   Right/Left Shoulder Right   Right Shoulder Flexion 150 Degrees  previous: 128   Right Shoulder ABduction 120 Degrees  previous: 65   Right Shoulder Internal  Rotation 90 Degrees  previous: 90   Right Shoulder External Rotation 80 Degrees  58   PROM   Overall PROM Comments Assessed supine. IR/er adducted.   PROM Assessment Site Shoulder   Right/Left Shoulder Right   Right Shoulder Flexion 160 Degrees  previous: 143   Right Shoulder ABduction 180 Degrees  previous:116   Right Shoulder Internal Rotation 90 Degrees  previous: 90   Right Shoulder External Rotation 70 Degrees  previous: 56   Strength   Overall Strength Comments Assessed seated. ER/ir adducted.    Strength Assessment Site Shoulder   Right/Left Shoulder Right   Right Shoulder Flexion 3+/5  previous: 3/5   Right Shoulder ABduction 3+/5  previous: 3+/5   Right Shoulder Internal Rotation 3/5  previous: 3/5   Right Shoulder External Rotation 5/5  previous: 3/5                  OT Treatments/Exercises (OP) - 02/02/15 1714    Exercises   Exercises Shoulder   Shoulder Exercises: Supine   Protraction PROM;5 reps;Strengthening;12 reps   Protraction Weight (lbs) 2   Horizontal ABduction PROM;5 reps;Strengthening;12 reps   Horizontal ABduction Weight (lbs) 2   External Rotation PROM;5 reps;Strengthening;12 reps   External Rotation Weight (lbs) 2   Internal Rotation PROM;5 reps;Strengthening;12 reps   Internal Rotation Weight (lbs) 2   Flexion PROM;5 reps;Strengthening;12 reps   Shoulder Flexion Weight (  lbs) 2   ABduction PROM;5 reps;Strengthening;12 reps   Shoulder ABduction Weight (lbs) 2   Shoulder Exercises: Prone   Retraction Strengthening;12 reps   Retraction Weight (lbs) 1   Other Prone Exercises [place] exercises 12X; 1#   Other Prone Exercises Scapular raises; 12X; 1#   Shoulder Exercises: ROM/Strengthening   Cybex Press 1.5 plate  26V   Cybex Row 2 plate  78H   Proximal Shoulder Strengthening, Supine 12X each 2# weight 1 rest break   Ball on Wall 1' flexion 1' abduction green ball                  OT Short Term Goals - 02/02/15 1709     OT SHORT TERM GOAL #1   Title Patient will be educated and independent with HEP.   Time 6   Period Weeks   Status Achieved   OT SHORT TERM GOAL #2   Title Patient will increase PROM to [facility] to increase ability to complete dressing tasks.    Time 6   Period Weeks   OT SHORT TERM GOAL #3   Title Patient will increase right shoulder strength to 3/5 to increase ability to hold onto light weight items.    Time 6   Period Weeks   OT SHORT TERM GOAL #4   Title Patient will decrease pain to 3/10 or less during daily tasks.   Time 6   Period Weeks   OT SHORT TERM GOAL #5   Title Patient will decrease fascial restrictions to mod amount.    Time 6   Period Weeks           OT Long Term Goals - 02/02/15 1709    OT LONG TERM GOAL #1   Title patient will return to highest level of independence with all daily, leisure, and work related tasks.    Time 12   Period Weeks   Status On-going   OT LONG TERM GOAL #2   Title Patient will increase AROM to [facility] to increase ability to complete tasks above shoulder height.    Time 12   Period Weeks   OT LONG TERM GOAL #3   Title Patient will increase right shoulder strength to 4/5 to increase ability to return to work and perform required tasks.   Time 12   Period Weeks   Status On-going   OT LONG TERM GOAL #4   Title Patient will decrease pain level to 1/10 or less during daily tasks.   Time 12   Period Weeks   Status On-going   OT LONG TERM GOAL #5   Title Patient will decrease fascial restrictions to min amount.    Time 12   Period Weeks   Status Achieved               Plan - 02/02/15 1738    Clinical Impression Statement A: Reassessment completed this date. patient met all short term goals and 2/5 long term goals. Patient has made great progress with ROM although strengthening is still an issue in regards to returning to work. Recommend continuing therapy to focus on shoulder and scapular strengthening for 3-4 more weeks.     Plan P: Add working hardening activity (lifting weight box overhead and lowering).         Problem List There are no active problems to display for this patient.   Ailene Ravel, OTR/L,CBIS  [phone]  02/02/2015, 5:41 PM  [facility] [address] [place], [place], [address] Phone: [phone]   Fax:  [phone]  Name: LUCINE BILSKI MRN: [id number] Date of Birth: Jun 15, 1965

## 2015-02-04 ENCOUNTER — Encounter (HOSPITAL_COMMUNITY): Payer: Self-pay

## 2015-02-04 ENCOUNTER — Telehealth (HOSPITAL_COMMUNITY): Payer: Self-pay

## 2015-02-04 NOTE — Telephone Encounter (Signed)
Pt called and stated that she was released from the MD today and he ask her if Workman's comp was going to continue to pay for OT, Boyd Kerbsenny wants to call and find out and call us back at a later date to reschedule. PUT ON HOLD Please, NF 1/5/1

## 2015-02-05 ENCOUNTER — Ambulatory Visit (HOSPITAL_COMMUNITY): Payer: Worker's Compensation

## 2015-02-05 ENCOUNTER — Encounter (HOSPITAL_COMMUNITY): Payer: Self-pay

## 2015-02-05 NOTE — Therapy (Signed)
[facility] [address] [place], [place], [address] Phone: [phone]   Fax:  [phone]  Patient Details  Name: Deborah Baker MRN: [id number] Date of Birth: Sep 14, 1965 Referring Provider:  No ref. provider found  Encounter Date: 02/05/2015  Patient was released from MD and doesn't know if Worker's Comp will continue to cover OT. She wishes to be put on hold while she finds out.    Limmie PatriciaLaura Quantavia Frith, OTR/L,CBIS  [phone]  02/05/2015, 4:19 PM  [facility] [address] [place], [place], [address] Phone: [phone]   Fax:  [phone]

## 2015-02-09 ENCOUNTER — Other Ambulatory Visit (HOSPITAL_COMMUNITY): Payer: Self-pay | Admitting: Internal Medicine

## 2015-02-09 DIAGNOSIS — E049 Nontoxic goiter, unspecified: Secondary | ICD-10-CM

## 2015-02-10 ENCOUNTER — Ambulatory Visit (HOSPITAL_COMMUNITY)
Admission: RE | Admit: 2015-02-10 | Discharge: 2015-02-10 | Disposition: A | Discharge status: Home / Self Care | Payer: Worker's Compensation | Source: Ambulatory Visit | Attending: Internal Medicine | Admitting: Internal Medicine

## 2015-02-10 DIAGNOSIS — E010 Iodine-deficiency related diffuse (endemic) goiter: Secondary | ICD-10-CM | POA: Insufficient documentation

## 2015-02-10 DIAGNOSIS — E041 Nontoxic single thyroid nodule: Secondary | ICD-10-CM | POA: Diagnosis not present

## 2015-02-10 DIAGNOSIS — E049 Nontoxic goiter, unspecified: Secondary | ICD-10-CM

## 2015-02-11 ENCOUNTER — Other Ambulatory Visit (HOSPITAL_COMMUNITY): Payer: Self-pay | Admitting: Internal Medicine

## 2015-02-11 DIAGNOSIS — E049 Nontoxic goiter, unspecified: Secondary | ICD-10-CM

## 2015-02-15 ENCOUNTER — Encounter (HOSPITAL_COMMUNITY)
Admission: RE | Admit: 2015-02-15 | Discharge: 2015-02-15 | Disposition: A | Discharge status: Home / Self Care | Payer: Worker's Compensation | Source: Ambulatory Visit | Attending: Internal Medicine | Admitting: Internal Medicine

## 2015-02-15 ENCOUNTER — Encounter (HOSPITAL_COMMUNITY): Payer: Self-pay

## 2015-02-15 DIAGNOSIS — E049 Nontoxic goiter, unspecified: Secondary | ICD-10-CM | POA: Insufficient documentation

## 2015-02-15 MED ORDER — SODIUM IODIDE I 131 CAPSULE
14.0000 | Freq: Once | INTRAVENOUS | Status: AC | PRN
  Administered 2015-02-15: 14 via ORAL

## 2015-02-16 ENCOUNTER — Encounter (HOSPITAL_COMMUNITY)
Admission: RE | Admit: 2015-02-16 | Discharge: 2015-02-16 | Disposition: A | Discharge status: Home / Self Care | Payer: Worker's Compensation | Source: Ambulatory Visit | Attending: Internal Medicine | Admitting: Internal Medicine

## 2015-02-16 DIAGNOSIS — E049 Nontoxic goiter, unspecified: Secondary | ICD-10-CM | POA: Diagnosis not present

## 2015-02-16 MED ORDER — SODIUM PERTECHNETATE TC 99M INJECTION
10.0000 | Freq: Once | INTRAVENOUS | Status: AC | PRN
  Administered 2015-02-16: 10 via INTRAVENOUS

## 2015-02-22 ENCOUNTER — Ambulatory Visit (HOSPITAL_COMMUNITY): Payer: Worker's Compensation | Admitting: Specialist

## 2015-02-22 DIAGNOSIS — M629 Disorder of muscle, unspecified: Secondary | ICD-10-CM

## 2015-02-22 DIAGNOSIS — M25611 Stiffness of right shoulder, not elsewhere classified: Secondary | ICD-10-CM

## 2015-02-22 DIAGNOSIS — M6289 Other specified disorders of muscle: Secondary | ICD-10-CM

## 2015-02-22 DIAGNOSIS — R29898 Other symptoms and signs involving the musculoskeletal system: Secondary | ICD-10-CM

## 2015-02-22 DIAGNOSIS — M25511 Pain in right shoulder: Secondary | ICD-10-CM

## 2015-02-22 DIAGNOSIS — Z9889 Other specified postprocedural states: Secondary | ICD-10-CM

## 2015-02-22 NOTE — Patient Instructions (Signed)
Deborah Baker or Deborah Baker [phone]   Chest / Shoulder Stretch: Yardstick Along Spine     Hold stick horizontally against buttocks, hands shoulder width apart. Bend elbows, and raise stick along spine. Hold 1 count. Slowly return to starting position. Repeat _10___ times.  Copyright  VHI. All rights reserved.  ROM: Towel Stretch - with Interior Rotation    Pull right arm up behind back by pulling towel up with other arm. Hold __5-10__ seconds. Repeat __3-4__ times per set. Do __1__ sets per session. Do _2___ sessions per day.  http://orth.exer.us/889   Copyright  VHI. All rights reserved.

## 2015-02-22 NOTE — Therapy (Signed)
[place] [address] [place], [place], [address] Phone: [phone]   Fax:  [phone]  Occupational Therapy Treatment  Patient Details  Name: Deborah Baker MRN: [id number] Date of Birth: 02/14/1965 Referring Provider: Dr. Teryl Lucy, MD  Encounter Date: 02/22/2015      OT End of Session - 02/22/15 1502    Visit Number 32   Number of Visits 39   Date for OT Re-Evaluation 04/03/15   Authorization Type Worker's Comp   OT Start Time 1350   OT Stop Time 1432   OT Time Calculation (min) 42 min   Activity Tolerance Patient tolerated treatment well   Behavior During Therapy [facility] for tasks assessed/performed      Past Medical History  Diagnosis Date  . Hypertension   . Outbursts of anger   . Hyperlipemia     Past Surgical History  Procedure Laterality Date  . Rotator cuff repair      There were no vitals filed for this visit.  Visit Diagnosis:  Shoulder weakness  Shoulder stiffness, right  Tight fascia  Right shoulder pain  S/P rotator cuff repair      Subjective Assessment - 02/22/15 1454    Subjective  S:  I feel like the things I need to work on the most are reaching behind my back to fasten my bra and strength when reaching forward or out to the side.     Limitations progress as tolerated    Currently in Pain? No/denies   Pain Score 0-No pain            [facility] OT Assessment - 02/22/15 0001    Assessment   Diagnosis Right rotator cuff repair   Referring Provider Dr. Teryl Lucy, MD   Precautions   Precautions Shoulder   Type of Shoulder Precautions progress as tolerated   Restrictions   Weight Bearing Restrictions No   Balance Screen   Has the patient fallen in the past 6 months No   Has the patient had a decrease in activity level because of a fear of falling?  No   Is the patient reluctant to leave their home because of a fear of falling?  No   Home  Environment   Family/patient expects to  be discharged to: Private residence   Prior Function   Level of Independence Independent   Vocation Full time employment   Vocation Requirements [employer]    Leisure Drives a dump truck   ADL   ADL comments patient continues to have difficulty reaching into internal rotation to fasten her bra, and lifting items forward and overhead or out to the side and overhead   Mobility   Mobility Status Independent   Written Expression   Dominant Hand Right   Vision - History   Baseline Vision No visual deficits   Cognition   Overall Cognitive Status Within Functional Limits for tasks assessed   Observation/Other Assessments   Focus on Therapeutic Outcomes (FOTO)  clinical observation   Sensation   Light Touch Appears Intact   AROM   Overall AROM Comments assessed in seated, er with shoulder adducted and IR with shoulder abducted to 90   AROM Assessment Site Shoulder   Right/Left Shoulder Right   Right Shoulder Flexion 145 Degrees   Right Shoulder ABduction 145 Degrees   Right Shoulder Internal Rotation 65 Degrees   Right Shoulder External Rotation 80 Degrees   PROM   Overall PROM Comments Assessed in supine  and is [facility] this date   Strength   Right Shoulder Flexion 4-/5   Right Shoulder ABduction 4-/5   Right Shoulder Internal Rotation 4-/5   Right Shoulder External Rotation 4-/5                  OT Treatments/Exercises (OP) - 02/22/15 0001    Exercises   Exercises Shoulder   Shoulder Exercises: Supine   Protraction PROM;5 reps;Strengthening;10 reps   Protraction Weight (lbs) 2   Horizontal ABduction PROM;5 reps;Strengthening;10 reps   Horizontal ABduction Weight (lbs) 2   External Rotation PROM;5 reps;Strengthening;10 reps   External Rotation Weight (lbs) 2   Internal Rotation PROM;5 reps;Strengthening;10 reps   Internal Rotation Weight (lbs) 2   Flexion PROM;5 reps;Strengthening;10 reps   Shoulder Flexion Weight (lbs) 2   ABduction PROM;5  reps;Strengthening;10 reps   Shoulder ABduction Weight (lbs) 2   Shoulder Exercises: ROM/Strengthening   "W" Arms 15 times   X to V Arms 15 times    Proximal Shoulder Strengthening, Supine 10 X each with 2#   Shoulder Exercises: Stretch   Internal Rotation Stretch 10 seconds   Internal Rotation Stretch Limitations with towel, very painful   Manual Therapy   Manual Therapy Myofascial release   Manual therapy comments Manual therapy completed prior to exercises.   Myofascial Release Myofascial release to right upper arm, deltoid, trapezius, and scapularis regions to decrease pain and fascial restrictions and increase joint range of motion. Myofascial release to cervical region and sternocleidomastoid region.  Scar massage/release to lateral healed surgical incision this date.    stretched er/IR with shoulder abducted to 90                OT Education - 02/22/15 1501    Education provided Yes   Education Details internal rotation with dowel rod behind back and towel stretch for external rotation   Person(s) Educated Patient   Methods Explanation;Demonstration;Handout   Comprehension Returned demonstration;Verbalized understanding          OT Short Term Goals - 02/02/15 1709    OT SHORT TERM GOAL #1   Title Patient will be educated and independent with HEP.   Time 6   Period Weeks   Status Achieved   OT SHORT TERM GOAL #2   Title Patient will increase PROM to [facility] to increase ability to complete dressing tasks.    Time 6   Period Weeks   OT SHORT TERM GOAL #3   Title Patient will increase right shoulder strength to 3/5 to increase ability to hold onto light weight items.    Time 6   Period Weeks   OT SHORT TERM GOAL #4   Title Patient will decrease pain to 3/10 or less during daily tasks.   Time 6   Period Weeks   OT SHORT TERM GOAL #5   Title Patient will decrease fascial restrictions to mod amount.    Time 6   Period Weeks           OT Long Term Goals -  02/22/15 1507    OT LONG TERM GOAL #1   Title patient will return to highest level of independence with all daily, leisure, and work related tasks.    Time 12   Period Weeks   Status On-going   OT LONG TERM GOAL #2   Title Patient will increase AROM to WNL to increase ability to complete tasks above shoulder height and to be able to internally rotate to  fasten her bra behind her back.   Time 12   Period Weeks   Status On-going   OT LONG TERM GOAL #3   Title Patient will increase right shoulder strength to 4+/5 to increase ability to return to work and perform required tasks.   Time 12   Period Weeks   Status On-going   OT LONG TERM GOAL #4   Title Patient will decrease pain level to 1/10 or less during daily tasks.   Time 12   Period Weeks   Status On-going   OT LONG TERM GOAL #5   Title Patient will decrease fascial restrictions to min amount in her right shoulder region in order to have increased mobility needed to complete daily activities.    Time 12   Period Weeks   Status Achieved               Plan - 02/22/15 1503    Clinical Impression Statement A:  Patient returned to therapy after a 3 week break awaiting a new MD order for therapy as payor has changed.  A/ROM has improved in all areas except flexion and shoulder strength has improved in all ranges.  Patient feels that she is making progress and her main deficits include inability to internally rotate to reach behind her back and fasten her bra, and difficulty with lifitng items into flexion overhead and abduction and overhead.     OT Frequency 2x / week   OT Duration 4 weeks   OT Treatment/Interventions Self-care/ADL training;Scar mobilization;DME and/or AE instruction;Passive range of motion;Patient/family education;Cryotherapy;Electrical Stimulation;Moist Heat;Therapeutic activities;Therapeutic exercises;Manual Therapy   Plan P:  Continue skilled OT intervention 2 times per week focusing on improving internal  rotation so that she can fasten her bra, end range A/ROM for flexion, abduction, and extermal rotation, and improving shoulder strength so that she can lift items out to her side and overhead when she returns to work.     Consulted and Agree with Plan of Care Patient        Problem List There are no active problems to display for this patient.   Shirlean Mylar, OTR/L [phone]  02/22/2015, 3:10 PM  [place] [facility] [address] [place], [place], [address] Phone: [phone]   Fax:  [phone]  Name: Deborah Baker MRN: [id number] Date of Birth: 1965-10-05

## 2015-02-26 ENCOUNTER — Ambulatory Visit (HOSPITAL_COMMUNITY): Payer: Worker's Compensation | Admitting: Occupational Therapy

## 2015-02-26 ENCOUNTER — Encounter (HOSPITAL_COMMUNITY): Payer: Self-pay | Admitting: Occupational Therapy

## 2015-02-26 DIAGNOSIS — M6289 Other specified disorders of muscle: Secondary | ICD-10-CM

## 2015-02-26 DIAGNOSIS — R29898 Other symptoms and signs involving the musculoskeletal system: Secondary | ICD-10-CM | POA: Diagnosis not present

## 2015-02-26 DIAGNOSIS — M629 Disorder of muscle, unspecified: Secondary | ICD-10-CM

## 2015-02-26 DIAGNOSIS — M25611 Stiffness of right shoulder, not elsewhere classified: Secondary | ICD-10-CM

## 2015-02-26 DIAGNOSIS — M25511 Pain in right shoulder: Secondary | ICD-10-CM

## 2015-02-26 NOTE — Therapy (Signed)
[place] [facility] [address] [place], [place], [address] Phone: [phone]   Fax:  [phone]  Occupational Therapy Treatment  Patient Details  Name: Deborah Baker MRN: [id number] Date of Birth: 06-13-1965 Referring Provider: Dr. Teryl Lucy, MD  Encounter Date: 02/26/2015      OT End of Session - 02/26/15 1618    Visit Number 33   Number of Visits 39   Date for OT Re-Evaluation 04/03/15   Authorization Type Worker's Comp   OT Start Time 1432   OT Stop Time 1515   OT Time Calculation (min) 43 min   Activity Tolerance Patient tolerated treatment well   Behavior During Therapy [facility] for tasks assessed/performed      Past Medical History  Diagnosis Date  . Hypertension   . Outbursts of anger   . Hyperlipemia     Past Surgical History  Procedure Laterality Date  . Rotator cuff repair      There were no vitals filed for this visit.  Visit Diagnosis:  Shoulder weakness  Shoulder stiffness, right  Tight fascia  Right shoulder pain      Subjective Assessment - 02/26/15 1433    Subjective  S: It's just my strength I'm having trouble with.    Currently in Pain? No/denies            [facility] OT Assessment - 02/26/15 1432    Assessment   Diagnosis Right rotator cuff repair   Precautions   Precautions Shoulder   Type of Shoulder Precautions progress as tolerated                  OT Treatments/Exercises (OP) - 02/26/15 1434    Exercises   Exercises Shoulder   Shoulder Exercises: Supine   Protraction PROM;5 reps;Strengthening;10 reps   Protraction Weight (lbs) 2   Horizontal ABduction PROM;5 reps;Strengthening;10 reps   Horizontal ABduction Weight (lbs) 2   External Rotation PROM;5 reps;Strengthening;10 reps   External Rotation Weight (lbs) 2   Internal Rotation PROM;5 reps;Strengthening;10 reps   Internal Rotation Weight (lbs) 2   Flexion PROM;5 reps;Strengthening;10 reps   Shoulder Flexion Weight  (lbs) 2   ABduction PROM;5 reps;Strengthening;10 reps   Shoulder ABduction Weight (lbs) 2   Shoulder Exercises: ROM/Strengthening   Cybex Press 2 plate;10 reps   Cybex Row 2 plate;10 reps   Proximal Shoulder Strengthening, Supine 10 X each with 2#   Ball on Wall 1' flexion 1' abduction green ball   Shoulder Exercises: Stretch   Internal Rotation Stretch 3 reps  10 seconds   Internal Rotation Stretch Limitations with towel horizontally and vertically   External Rotation Stretch 3 reps;10 seconds  doorway stretch   Star Gazer Stretch 3 reps;10 seconds   Manual Therapy   Manual Therapy Myofascial release   Manual therapy comments Manual therapy completed prior to exercises.   Myofascial Release Myofascial release to right upper arm, deltoid, trapezius, and scapularis regions to decrease pain and fascial restrictions and increase joint range of motion. Myofascial release to cervical region and sternocleidomastoid region.  Scar massage/release to lateral healed surgical incision this date.                    OT Short Term Goals - 02/02/15 1709    OT SHORT TERM GOAL #1   Title Patient will be educated and independent with HEP.   Time 6   Period Weeks   Status Achieved   OT SHORT TERM GOAL #  2   Title Patient will increase PROM to [facility] to increase ability to complete dressing tasks.    Time 6   Period Weeks   OT SHORT TERM GOAL #3   Title Patient will increase right shoulder strength to 3/5 to increase ability to hold onto light weight items.    Time 6   Period Weeks   OT SHORT TERM GOAL #4   Title Patient will decrease pain to 3/10 or less during daily tasks.   Time 6   Period Weeks   OT SHORT TERM GOAL #5   Title Patient will decrease fascial restrictions to mod amount.    Time 6   Period Weeks           OT Long Term Goals - 02/22/15 1507    OT LONG TERM GOAL #1   Title patient will return to highest level of independence with all daily, leisure, and work  related tasks.    Time 12   Period Weeks   Status On-going   OT LONG TERM GOAL #2   Title Patient will increase AROM to WNL to increase ability to complete tasks above shoulder height and to be able to internally rotate to fasten her bra behind her back.   Time 12   Period Weeks   Status On-going   OT LONG TERM GOAL #3   Title Patient will increase right shoulder strength to 4+/5 to increase ability to return to work and perform required tasks.   Time 12   Period Weeks   Status On-going   OT LONG TERM GOAL #4   Title Patient will decrease pain level to 1/10 or less during daily tasks.   Time 12   Period Weeks   Status On-going   OT LONG TERM GOAL #5   Title Patient will decrease fascial restrictions to min amount in her right shoulder region in order to have increased mobility needed to complete daily activities.    Time 12   Period Weeks   Status Achieved               Plan - 02/26/15 1618    Clinical Impression Statement A: Continued strengthening with 2# weight, completed supine ER/IR in abduction. Added ER and IR stretches, pt with mod difficulty. Pt required intermittent verbal cuing for form during exercises. Minimal fatigue reported at end of session.    Plan P: Continue with focus on improving ER/IR, add sleeper stretch. Provide HEP with star-gazer and sleeper stretches.         Problem List There are no active problems to display for this patient.   Ezra Sites, OTR/L  [phone]  02/26/2015, 4:20 PM  [place] [facility] [address] [place], [place], [address] Phone: [phone]   Fax:  [phone]  Name: Deborah Baker MRN: [id number] Date of Birth: 07/03/1965

## 2015-03-03 ENCOUNTER — Encounter (HOSPITAL_COMMUNITY): Payer: Self-pay | Admitting: Occupational Therapy

## 2015-03-03 ENCOUNTER — Ambulatory Visit (HOSPITAL_COMMUNITY): Payer: Worker's Compensation | Attending: Orthopedic Surgery | Admitting: Occupational Therapy

## 2015-03-03 DIAGNOSIS — M629 Disorder of muscle, unspecified: Secondary | ICD-10-CM | POA: Insufficient documentation

## 2015-03-03 DIAGNOSIS — M25511 Pain in right shoulder: Secondary | ICD-10-CM | POA: Diagnosis present

## 2015-03-03 DIAGNOSIS — M25611 Stiffness of right shoulder, not elsewhere classified: Secondary | ICD-10-CM | POA: Diagnosis present

## 2015-03-03 DIAGNOSIS — M6289 Other specified disorders of muscle: Secondary | ICD-10-CM

## 2015-03-03 DIAGNOSIS — R29898 Other symptoms and signs involving the musculoskeletal system: Secondary | ICD-10-CM

## 2015-03-03 NOTE — Patient Instructions (Signed)
   Sleeper Stretch: Lay on right side, with arm out to side. Using left arm, push right arm down until stretch is felt. Hold for 10 seconds, complete 3 times.

## 2015-03-03 NOTE — Therapy (Signed)
[place] [facility] [address] [place], [place], [address] Phone: [phone]   Fax:  [phone]  Occupational Therapy Treatment  Patient Details  Name: Deborah Baker MRN: [id number] Date of Birth: 1965/07/11 Referring Provider: Dr. Teryl Lucy, MD  Encounter Date: 03/03/2015      OT End of Session - 03/03/15 1036    Visit Number 34   Number of Visits 39   Date for OT Re-Evaluation 04/03/15   Authorization Type Worker's Comp   OT Start Time 0931   OT Stop Time 1015   OT Time Calculation (min) 44 min   Activity Tolerance Patient tolerated treatment well   Behavior During Therapy [facility] for tasks assessed/performed      Past Medical History  Diagnosis Date  . Hypertension   . Outbursts of anger   . Hyperlipemia     Past Surgical History  Procedure Laterality Date  . Rotator cuff repair      There were no vitals filed for this visit.  Visit Diagnosis:  Shoulder weakness  Shoulder stiffness, right  Tight fascia  Right shoulder pain      Subjective Assessment - 03/03/15 0932    Subjective  S: It's doing ok today, just feels tight.    Currently in Pain? No/denies            [facility] OT Assessment - 03/03/15 1034    Assessment   Diagnosis Right rotator cuff repair   Precautions   Precautions Shoulder   Type of Shoulder Precautions progress as tolerated                  OT Treatments/Exercises (OP) - 03/03/15 0933    Exercises   Exercises Shoulder   Shoulder Exercises: Supine   Protraction PROM;5 reps;Strengthening;12 reps   Protraction Weight (lbs) 2   Horizontal ABduction PROM;5 reps;Strengthening;12 reps   Horizontal ABduction Weight (lbs) 2   External Rotation PROM;5 reps;Strengthening;12 reps   External Rotation Weight (lbs) 2   Internal Rotation PROM;5 reps;Strengthening;12 reps   Internal Rotation Weight (lbs) 2   Flexion PROM;5 reps;Strengthening;12 reps   Shoulder Flexion Weight (lbs) 2   ABduction PROM;5 reps;Strengthening;12 reps   Shoulder ABduction Weight (lbs) 2   Shoulder Exercises: Prone   Other Prone Exercises Hughston exercises 10X; 1#   Shoulder Exercises: Standing   Protraction Strengthening;10 reps   Protraction Weight (lbs) 2   Horizontal ABduction Strengthening;10 reps   Horizontal ABduction Weight (lbs) 2   External Rotation Strengthening;10 reps   External Rotation Weight (lbs) 2   Internal Rotation Strengthening;10 reps   Internal Rotation Weight (lbs) 2   Flexion Strengthening;10 reps   Shoulder Flexion Weight (lbs) 2   ABduction Strengthening;10 reps   Shoulder ABduction Weight (lbs) 2   Shoulder Exercises: ROM/Strengthening   Proximal Shoulder Strengthening, Supine 10 X each with 2#   Ball on Wall 1' flexion 1' abduction green ball   Shoulder Exercises: Stretch   Internal Rotation Stretch 3 reps  10 seconds   Internal Rotation Stretch Limitations with towel vertically   Other Shoulder Stretches sleeper stretch: 3 reps 10 seconds   Manual Therapy   Manual Therapy Myofascial release   Manual therapy comments Manual therapy completed prior to exercises.   Myofascial Release Myofascial release to right upper arm, deltoid, trapezius, and scapularis regions to decrease pain and fascial restrictions and increase joint range of motion. Myofascial release to cervical region and sternocleidomastoid region.  Scar massage/release to lateral healed  surgical incision this date.                  OT Education - 03/03/15 1035    Education provided Yes   Education Details sleeper stretch for [employer]   Person(s) Educated Patient   Methods Explanation;Demonstration;Handout   Comprehension Verbalized understanding;Returned demonstration          OT Short Term Goals - 02/02/15 1709    OT SHORT TERM GOAL #1   Title Patient will be educated and independent with HEP.   Time 6   Period Weeks   Status Achieved   OT SHORT TERM GOAL #2   Title Patient will  increase PROM to [facility] to increase ability to complete dressing tasks.    Time 6   Period Weeks   OT SHORT TERM GOAL #3   Title Patient will increase right shoulder strength to 3/5 to increase ability to hold onto light weight items.    Time 6   Period Weeks   OT SHORT TERM GOAL #4   Title Patient will decrease pain to 3/10 or less during daily tasks.   Time 6   Period Weeks   OT SHORT TERM GOAL #5   Title Patient will decrease fascial restrictions to mod amount.    Time 6   Period Weeks           OT Long Term Goals - 02/22/15 1507    OT LONG TERM GOAL #1   Title patient will return to highest level of independence with all daily, leisure, and work related tasks.    Time 12   Period Weeks   Status On-going   OT LONG TERM GOAL #2   Title Patient will increase AROM to WNL to increase ability to complete tasks above shoulder height and to be able to internally rotate to fasten her bra behind her back.   Time 12   Period Weeks   Status On-going   OT LONG TERM GOAL #3   Title Patient will increase right shoulder strength to 4+/5 to increase ability to return to work and perform required tasks.   Time 12   Period Weeks   Status On-going   OT LONG TERM GOAL #4   Title Patient will decrease pain level to 1/10 or less during daily tasks.   Time 12   Period Weeks   Status On-going   OT LONG TERM GOAL #5   Title Patient will decrease fascial restrictions to min amount in her right shoulder region in order to have increased mobility needed to complete daily activities.    Time 12   Period Weeks   Status Achieved               Plan - 03/03/15 1036    Clinical Impression Statement A: Resumed strengthening exercises in standing, prone hughston exercise, and added sleeper stretch for IR. Pt reports fatigue at end of session, stating that "my strength just isn't there." Verbal cuing for form and techniuqe during exercises.    Plan P: Continue working on improving ER/IR, follow  up on sleeper stretch. Add 2# weight to x to v arms.         Problem List There are no active problems to display for this patient.   Ezra Sites, OTR/L  [phone]  03/03/2015, 10:39 AM  [facility] [address] [place], [place], [address] Phone: [phone]   Fax:  [phone]  Name: Deborah Baker  MRN: [id number] Date of Birth: [birth year]

## 2015-03-05 ENCOUNTER — Ambulatory Visit (HOSPITAL_COMMUNITY): Payer: Worker's Compensation

## 2015-03-08 ENCOUNTER — Encounter (HOSPITAL_COMMUNITY): Payer: Self-pay

## 2015-03-08 ENCOUNTER — Ambulatory Visit (HOSPITAL_COMMUNITY): Payer: Worker's Compensation

## 2015-03-08 DIAGNOSIS — R29898 Other symptoms and signs involving the musculoskeletal system: Secondary | ICD-10-CM | POA: Diagnosis not present

## 2015-03-08 DIAGNOSIS — M6289 Other specified disorders of muscle: Secondary | ICD-10-CM

## 2015-03-08 DIAGNOSIS — M25611 Stiffness of right shoulder, not elsewhere classified: Secondary | ICD-10-CM

## 2015-03-08 DIAGNOSIS — M629 Disorder of muscle, unspecified: Secondary | ICD-10-CM

## 2015-03-08 NOTE — Therapy (Addendum)
[facility] [address] [place], [place], [address] Phone: [phone]   Fax:  [phone]  Occupational Therapy Treatment  Patient Details  Name: GUADALUPE KEREKES MRN: [id number] Date of Birth: Sep 27, 1965 Referring Provider: Dr. Teryl Lucy, MD  Encounter Date: 03/08/2015     03/08/15 1150  OT Visits / Re-Eval  Visit Number 35  Number of Visits 39  Date for OT Re-Evaluation 04/03/15  Authorization  Authorization Type Worker's Comp  OT Time Calculation  OT Start Time 1015  OT Stop Time 1100  OT Time Calculation (min) 45 min  End of Session  Activity Tolerance Patient tolerated treatment well  Behavior During Therapy [facility] for tasks assessed/performed    Past Medical History  Diagnosis Date  . Hypertension   . Outbursts of anger   . Hyperlipemia     Past Surgical History  Procedure Laterality Date  . Rotator cuff repair      There were no vitals filed for this visit.  Visit Diagnosis:  Shoulder weakness  Shoulder stiffness, right  Tight fascia      Subjective Assessment - 03/08/15 1044    Subjective  S: It just feels very tight today.    Currently in Pain? No/denies            [facility] OT Assessment - 03/08/15 1045    Assessment   Diagnosis Right rotator cuff repair   Precautions   Precautions Shoulder   Type of Shoulder Precautions progress as tolerated                  OT Treatments/Exercises (OP) - 03/08/15 1045    Exercises   Exercises Shoulder   Shoulder Exercises: Supine   Protraction PROM;5 reps;Strengthening;15 reps   Protraction Weight (lbs) 2   Horizontal ABduction PROM;5 reps;Strengthening;15 reps   Horizontal ABduction Weight (lbs) 2   External Rotation PROM;5 reps;Strengthening;15 reps   External Rotation Weight (lbs) 2   Internal Rotation PROM;5 reps;Strengthening;15 reps   Internal Rotation Weight (lbs) 2   Flexion PROM;5 reps;Strengthening;15 reps   Shoulder Flexion  Weight (lbs) 2   ABduction PROM;5 reps;Strengthening;15 reps   Shoulder ABduction Weight (lbs) 2   Shoulder Exercises: Standing   Protraction Strengthening;12 reps   Protraction Weight (lbs) 2   Horizontal ABduction Strengthening;12 reps   Horizontal ABduction Weight (lbs) 2   External Rotation Strengthening;12 reps   External Rotation Weight (lbs) 2   Internal Rotation Strengthening;12 reps   Internal Rotation Weight (lbs) 2   Flexion Strengthening;12 reps   Shoulder Flexion Weight (lbs) 2   ABduction Strengthening;12 reps   Shoulder ABduction Weight (lbs) 2   Extension Theraband;15 reps   Theraband Level (Shoulder Extension) Level 3 (Green)   Row [employer] reps   Theraband Level (Shoulder Row) Level 3 (Green)   Retraction Theraband;15 reps   Theraband Level (Shoulder Retraction) Level 3 (Green)   Shoulder Exercises: ROM/Strengthening   Cybex Press 2 plate;15 reps   Cybex Row 2 plate;15 reps   X to V Arms 12X with 2#   Proximal Shoulder Strengthening, Supine 15 X each with 2#   Proximal Shoulder Strengthening, Seated 12X with 2# 3-4 rest breaks   Ball on Wall 1' flexion 1' abduction green ball   Manual Therapy   Manual Therapy Myofascial release   Manual therapy comments Manual therapy completed prior to exercises.   Myofascial Release Myofascial release to right upper arm, deltoid, trapezius, and scapularis regions to decrease pain and fascial  restrictions and increase joint range of motion. Myofascial release to cervical region and sternocleidomastoid region.  Scar massage/release to lateral healed surgical incision this date.                    OT Short Term Goals - 03/08/15 1133    OT SHORT TERM GOAL #1   Title Patient will be educated and independent with HEP.   Time 6   Period Weeks   OT SHORT TERM GOAL #2   Title Patient will increase PROM to [facility] to increase ability to complete dressing tasks.    Time 6   Period Weeks   OT SHORT TERM GOAL #3   Title  Patient will increase right shoulder strength to 3/5 to increase ability to hold onto light weight items.    Time 6   Period Weeks   OT SHORT TERM GOAL #4   Title Patient will decrease pain to 3/10 or less during daily tasks.   Time 6   Period Weeks   OT SHORT TERM GOAL #5   Title Patient will decrease fascial restrictions to mod amount.    Time 6   Period Weeks           OT Long Term Goals - 03/08/15 1133    OT LONG TERM GOAL #1   Title patient will return to highest level of independence with all daily, leisure, and work related tasks.    Time 12   Period Weeks   Status On-going   OT LONG TERM GOAL #2   Title Patient will increase AROM to WNL to increase ability to complete tasks above shoulder height and to be able to internally rotate to fasten her bra behind her back.   Time 12   Period Weeks   Status On-going   OT LONG TERM GOAL #3   Title Patient will increase right shoulder strength to 4+/5 to increase ability to return to work and perform required tasks.   Time 12   Period Weeks   Status On-going   OT LONG TERM GOAL #4   Title Patient will decrease pain level to 1/10 or less during daily tasks.   Time 12   Period Weeks   Status On-going   OT LONG TERM GOAL #5   Title Patient will decrease fascial restrictions to min amount in her right shoulder region in order to have increased mobility needed to complete daily activities.    Time 12   Period Weeks               Plan - 03/08/15 1058    Clinical Impression Statement A: Completed X to V arms with 2#. pt reports that she still feels weak when she tries to lift anything heavy like a 10lb bag of sugar.    Plan P: Added weight box working on lifting from waist to overhead. Add flexion with theraband.         Problem List There are no active problems to display for this patient.  Limmie Patricia, OTR/L,CBIS  [phone]  03/08/2015, 11:35 AM  [facility] [address] [place], [place], [address] Phone: [phone]   Fax:  [phone]  Name: ELVY MCLARTY MRN: [id number] Date of Birth: 06/08/1965

## 2015-03-11 ENCOUNTER — Ambulatory Visit (HOSPITAL_COMMUNITY): Payer: Worker's Compensation

## 2015-03-11 ENCOUNTER — Encounter (HOSPITAL_COMMUNITY): Payer: Self-pay

## 2015-03-11 DIAGNOSIS — M629 Disorder of muscle, unspecified: Secondary | ICD-10-CM

## 2015-03-11 DIAGNOSIS — R29898 Other symptoms and signs involving the musculoskeletal system: Secondary | ICD-10-CM

## 2015-03-11 DIAGNOSIS — M25611 Stiffness of right shoulder, not elsewhere classified: Secondary | ICD-10-CM

## 2015-03-11 DIAGNOSIS — M6289 Other specified disorders of muscle: Secondary | ICD-10-CM

## 2015-03-11 NOTE — Therapy (Signed)
[facility] [facility] [address] [place], [place], [address] Phone: [phone]   Fax:  [phone]  Occupational Therapy Treatment  Patient Details  Name: Deborah Baker MRN: [id number] Date of Birth: 1965/03/06 Referring Provider: Dr. Teryl Lucy, MD  Encounter Date: 03/11/2015      OT End of Session - 03/11/15 1212    Visit Number 36   Number of Visits 39   Date for OT Re-Evaluation 04/03/15   Authorization Type Worker's Comp   OT Start Time 1020   OT Stop Time 1100   OT Time Calculation (min) 40 min   Activity Tolerance Patient tolerated treatment well   Behavior During Therapy [facility] for tasks assessed/performed      Past Medical History  Diagnosis Date  . Hypertension   . Outbursts of anger   . Hyperlipemia     Past Surgical History  Procedure Laterality Date  . Rotator cuff repair      There were no vitals filed for this visit.  Visit Diagnosis:  Shoulder stiffness, right  Shoulder weakness  Tight fascia      Subjective Assessment - 03/11/15 1210    Subjective  S: It just feels tight.    Currently in Pain? No/denies            [facility] OT Assessment - 03/11/15 1041    Assessment   Diagnosis Right rotator cuff repair   Precautions   Precautions Shoulder   Type of Shoulder Precautions progress as tolerated                  OT Treatments/Exercises (OP) - 03/11/15 1041    Exercises   Exercises Shoulder   Shoulder Exercises: Supine   Protraction PROM;5 reps;Strengthening;15 reps   Protraction Weight (lbs) 2   Horizontal ABduction PROM;5 reps;Strengthening;15 reps   Horizontal ABduction Weight (lbs) 2   External Rotation PROM;5 reps;Strengthening;15 reps   External Rotation Weight (lbs) 2   Internal Rotation PROM;5 reps;Strengthening;15 reps   Internal Rotation Weight (lbs) 2   Flexion PROM;5 reps;Strengthening;15 reps   Shoulder Flexion Weight (lbs) 2   ABduction PROM;5 reps;Strengthening;15  reps   Shoulder ABduction Weight (lbs) 2   Shoulder Exercises: Prone   Other Prone Exercises [place] exercises 12X; 2#   Other Prone Exercises Scapular raises; 12X; 2#   Shoulder Exercises: Standing   Protraction Theraband;12 reps   Theraband Level (Shoulder Protraction) Level 3 (Green)   Horizontal ABduction Theraband;12 reps   Theraband Level (Shoulder Horizontal ABduction) Level 3 (Green)   External Rotation Theraband;12 reps   Theraband Level (Shoulder External Rotation) Level 3 (Green)   Flexion Theraband;12 reps   Theraband Level (Shoulder Flexion) Level 3 (Green)   ABduction Theraband;12 reps   Theraband Level (Shoulder ABduction) Level 3 (Green)   Shoulder Exercises: ROM/Strengthening   Proximal Shoulder Strengthening, Supine 15 X each with 2#   Manual Therapy   Manual Therapy Myofascial release;Muscle Energy Technique   Manual therapy comments Manual therapy completed prior to exercises.   Myofascial Release Myofascial release to right upper arm, deltoid, trapezius, and scapularis regions to decrease pain and fascial restrictions and increase joint range of motion. Myofascial release to cervical region and sternocleidomastoid region.  Scar massage/release to lateral healed surgical incision this date.     Muscle Energy Technique Muscle energy technique to RUE to decrease muscle spasms and increase range of motion.  OT Short Term Goals - 03/08/15 1133    OT SHORT TERM GOAL #1   Title Patient will be educated and independent with HEP.   Time 6   Period Weeks   OT SHORT TERM GOAL #2   Title Patient will increase PROM to [facility] to increase ability to complete dressing tasks.    Time 6   Period Weeks   OT SHORT TERM GOAL #3   Title Patient will increase right shoulder strength to 3/5 to increase ability to hold onto light weight items.    Time 6   Period Weeks   OT SHORT TERM GOAL #4   Title Patient will decrease pain to 3/10 or less during daily  tasks.   Time 6   Period Weeks   OT SHORT TERM GOAL #5   Title Patient will decrease fascial restrictions to mod amount.    Time 6   Period Weeks           OT Long Term Goals - 03/08/15 1133    OT LONG TERM GOAL #1   Title patient will return to highest level of independence with all daily, leisure, and work related tasks.    Time 12   Period Weeks   Status On-going   OT LONG TERM GOAL #2   Title Patient will increase AROM to WNL to increase ability to complete tasks above shoulder height and to be able to internally rotate to fasten her bra behind her back.   Time 12   Period Weeks   Status On-going   OT LONG TERM GOAL #3   Title Patient will increase right shoulder strength to 4+/5 to increase ability to return to work and perform required tasks.   Time 12   Period Weeks   Status On-going   OT LONG TERM GOAL #4   Title Patient will decrease pain level to 1/10 or less during daily tasks.   Time 12   Period Weeks   Status On-going   OT LONG TERM GOAL #5   Title Patient will decrease fascial restrictions to min amount in her right shoulder region in order to have increased mobility needed to complete daily activities.    Time 12   Period Weeks               Plan - 03/11/15 1212    Clinical Impression Statement A: added theraband strengthening with green band although patient did struggle due to muscle fatigue.    Plan P: Add weight box working on lifting from waist to overhead. Cont with theraband strengthening focusing on flexion and abduction.         Problem List There are no active problems to display for this patient.   Limmie Patricia, OTR/L,CBIS  [phone]  03/11/2015, 12:16 PM  [facility] [address] [place], [place], [address] Phone: [phone]   Fax:  [phone]  Name: Deborah Baker MRN: [id number] Date of Birth: 1965-03-28

## 2015-03-15 ENCOUNTER — Ambulatory Visit (HOSPITAL_COMMUNITY): Payer: Worker's Compensation | Admitting: Occupational Therapy

## 2015-03-15 ENCOUNTER — Encounter (HOSPITAL_COMMUNITY): Payer: Self-pay | Admitting: Occupational Therapy

## 2015-03-15 DIAGNOSIS — M6289 Other specified disorders of muscle: Secondary | ICD-10-CM

## 2015-03-15 DIAGNOSIS — R29898 Other symptoms and signs involving the musculoskeletal system: Secondary | ICD-10-CM

## 2015-03-15 DIAGNOSIS — M25611 Stiffness of right shoulder, not elsewhere classified: Secondary | ICD-10-CM

## 2015-03-15 DIAGNOSIS — M629 Disorder of muscle, unspecified: Secondary | ICD-10-CM

## 2015-03-15 DIAGNOSIS — M25511 Pain in right shoulder: Secondary | ICD-10-CM

## 2015-03-15 NOTE — Therapy (Signed)
[facility] [address] [place], [place], [address] Phone: [phone]   Fax:  [phone]  Occupational Therapy Treatment  Patient Details  Name: Deborah Baker MRN: [id number] Date of Birth: 1965/03/16 Referring Provider: Dr. Teryl Lucy, MD  Encounter Date: 03/15/2015      OT End of Session - 03/15/15 1022    Visit Number 37   Number of Visits 39   Date for OT Re-Evaluation 04/03/15   Authorization Type Worker's Comp   OT Start Time 0933   OT Stop Time 1016   OT Time Calculation (min) 43 min   Activity Tolerance Patient tolerated treatment well   Behavior During Therapy [facility] for tasks assessed/performed      Past Medical History  Diagnosis Date  . Hypertension   . Outbursts of anger   . Hyperlipemia     Past Surgical History  Procedure Laterality Date  . Rotator cuff repair      There were no vitals filed for this visit.  Visit Diagnosis:  Shoulder stiffness, right  Shoulder weakness  Tight fascia  Right shoulder pain      Subjective Assessment - 03/15/15 0931    Subjective  S: I hurt all night after last time.    Currently in Pain? No/denies           [facility] OT Assessment - 03/15/15 0931    Assessment   Diagnosis Right rotator cuff repair   Precautions   Precautions Shoulder   Type of Shoulder Precautions progress as tolerated   AROM   Overall AROM Comments assessed in seated, er with shoulder adducted and IR with shoulder abducted to 90   AROM Assessment Site Shoulder   Right/Left Shoulder Right   Right Shoulder Flexion 162 Degrees  previous 145   Right Shoulder ABduction 170 Degrees  previous 145   Right Shoulder Internal Rotation 90 Degrees  previous 65   Right Shoulder External Rotation 80 Degrees  same as previous   PROM   Overall PROM Comments Assessed in supine and is [facility] this date   Strength   Overall Strength Comments Assessed seated. ER/ir adducted.    Strength Assessment Site  Shoulder   Right/Left Shoulder Right   Right Shoulder Flexion 4+/5  previous 4-/5   Right Shoulder ABduction 4+/5  previous 4-/5   Right Shoulder Internal Rotation 4/5  previous 4-/5   Right Shoulder External Rotation 4/5  previous 4-/5                  OT Treatments/Exercises (OP) - 03/15/15 0935    Exercises   Exercises Shoulder   Shoulder Exercises: Supine   Protraction PROM;5 reps;Strengthening;15 reps   Protraction Weight (lbs) 2   Horizontal ABduction PROM;5 reps;Strengthening;15 reps   Horizontal ABduction Weight (lbs) 2   External Rotation PROM;5 reps;Strengthening;15 reps   External Rotation Weight (lbs) 2   Internal Rotation PROM;5 reps;Strengthening;15 reps   Internal Rotation Weight (lbs) 2   Flexion PROM;5 reps;Strengthening;15 reps   Shoulder Flexion Weight (lbs) 2   ABduction PROM;5 reps;Strengthening;15 reps   Shoulder ABduction Weight (lbs) 2   Shoulder Exercises: Standing   Protraction Theraband;15 reps   Theraband Level (Shoulder Protraction) Level 3 (Green)   Horizontal ABduction Theraband;15 reps   Theraband Level (Shoulder Horizontal ABduction) Level 3 (Green)   External Rotation Theraband;15 reps   Theraband Level (Shoulder External Rotation) Level 3 (Green)   Internal Rotation Theraband;15 reps   Theraband Level (Shoulder Internal  Rotation) Level 3 (Green)   Flexion Theraband;15 reps   Theraband Level (Shoulder Flexion) Level 3 (Green)   ABduction Theraband;15 reps   Theraband Level (Shoulder ABduction) Level 3 (Green)   Shoulder Exercises: ROM/Strengthening   Proximal Shoulder Strengthening, Supine 15 X each with 2#   Other ROM/Strengthening Exercises Pt completed overhead lifting task this session, lifting weighted box from waist level to shelf overhead. 10#, 10X. Min fatigue noted near end of task.    Manual Therapy   Manual Therapy Myofascial release;Muscle Energy Technique   Manual therapy comments Manual therapy completed prior to  exercises.   Myofascial Release Myofascial release to right upper arm, deltoid, trapezius, and scapularis regions to decrease pain and fascial restrictions and increase joint range of motion. Myofascial release to cervical region and sternocleidomastoid region.  Scar massage/release to lateral healed surgical incision this date.     Muscle Energy Technique Muscle energy technique to RUE to decrease muscle spasms and increase range of motion.                 OT Short Term Goals - 03/08/15 1133    OT SHORT TERM GOAL #1   Title Patient will be educated and independent with HEP.   Time 6   Period Weeks   OT SHORT TERM GOAL #2   Title Patient will increase PROM to [facility] to increase ability to complete dressing tasks.    Time 6   Period Weeks   OT SHORT TERM GOAL #3   Title Patient will increase right shoulder strength to 3/5 to increase ability to hold onto light weight items.    Time 6   Period Weeks   OT SHORT TERM GOAL #4   Title Patient will decrease pain to 3/10 or less during daily tasks.   Time 6   Period Weeks   OT SHORT TERM GOAL #5   Title Patient will decrease fascial restrictions to mod amount.    Time 6   Period Weeks           OT Long Term Goals - 03/08/15 1133    OT LONG TERM GOAL #1   Title patient will return to highest level of independence with all daily, leisure, and work related tasks.    Time 12   Period Weeks   Status On-going   OT LONG TERM GOAL #2   Title Patient will increase AROM to WNL to increase ability to complete tasks above shoulder height and to be able to internally rotate to fasten her bra behind her back.   Time 12   Period Weeks   Status On-going   OT LONG TERM GOAL #3   Title Patient will increase right shoulder strength to 4+/5 to increase ability to return to work and perform required tasks.   Time 12   Period Weeks   Status On-going   OT LONG TERM GOAL #4   Title Patient will decrease pain level to 1/10 or less during daily  tasks.   Time 12   Period Weeks   Status On-going   OT LONG TERM GOAL #5   Title Patient will decrease fascial restrictions to min amount in her right shoulder region in order to have increased mobility needed to complete daily activities.    Time 12   Period Weeks               Plan - 03/15/15 1022    Clinical Impression Statement A: Measurements taken for MD appt.  Added weighted lifting task, continued with green theraband strengthening exercises. Pt with min fatigue at end of lifting task, no increased pain. Pt reports increased tightness after previous session with therabands, no complaints of pain or soreness after today's session with therabands. Verbal cuing for form during standing theraband exercises.    Plan P: Continue with weighted lifting task; add theraband exercises to HEP.         Problem List There are no active problems to display for this patient.   Ezra Sites, OTR/L  [phone]  03/15/2015, 10:26 AM  [facility] [address] [place], [place], [address] Phone: [phone]   Fax:  [phone]  Name: Deborah Baker MRN: [id number] Date of Birth: 08-22-1965

## 2015-03-17 ENCOUNTER — Telehealth (HOSPITAL_COMMUNITY): Payer: Self-pay | Admitting: Specialist

## 2015-03-17 NOTE — Telephone Encounter (Signed)
Faxed all notes on shoulder from 1/23-2/13/2017 NF 03/17/15 to Myrene Buddy [phone], NF

## 2015-03-19 ENCOUNTER — Encounter (HOSPITAL_COMMUNITY): Payer: Self-pay

## 2015-03-19 ENCOUNTER — Ambulatory Visit (HOSPITAL_COMMUNITY): Payer: Worker's Compensation

## 2015-03-19 DIAGNOSIS — R29898 Other symptoms and signs involving the musculoskeletal system: Secondary | ICD-10-CM

## 2015-03-19 DIAGNOSIS — M25611 Stiffness of right shoulder, not elsewhere classified: Secondary | ICD-10-CM

## 2015-03-19 DIAGNOSIS — M629 Disorder of muscle, unspecified: Secondary | ICD-10-CM

## 2015-03-19 DIAGNOSIS — M6289 Other specified disorders of muscle: Secondary | ICD-10-CM

## 2015-03-19 NOTE — Therapy (Signed)
[facility] [address] [place], [place], [address] Phone: [phone]   Fax:  [phone]  Occupational Therapy Treatment  Patient Details  Name: Deborah Baker MRN: [id number] Date of Birth: [birth year] Referring Provider: Dr. Teryl Lucy, MD  Encounter Date: 03/19/2015      OT End of Session - 03/19/15 1414    Visit Number 38   Number of Visits 39   Date for OT Re-Evaluation 04/03/15   Authorization Type Worker's Comp   OT Start Time 1328   OT Stop Time 1415   OT Time Calculation (min) 47 min   Activity Tolerance Patient tolerated treatment well   Behavior During Therapy [facility] for tasks assessed/performed      Past Medical History  Diagnosis Date  . Hypertension   . Outbursts of anger   . Hyperlipemia     Past Surgical History  Procedure Laterality Date  . Rotator cuff repair      There were no vitals filed for this visit.  Visit Diagnosis:  Shoulder stiffness, right  Shoulder weakness  Tight fascia      Subjective Assessment - 03/19/15 1344    Subjective  S: I saw the Doctor yesterday. He said to continue with therapy and he'll see me again in 6 weeks.   Currently in Pain? No/denies            [facility] OT Assessment - 03/19/15 1345    Assessment   Diagnosis Right rotator cuff repair   Precautions   Precautions Shoulder   Type of Shoulder Precautions progress as tolerated                  OT Treatments/Exercises (OP) - 03/19/15 1355    Exercises   Exercises Shoulder   Shoulder Exercises: Supine   Protraction PROM;5 reps;Strengthening;10 reps   Protraction Weight (lbs) 3   Horizontal ABduction PROM;5 reps;Strengthening;10 reps   Horizontal ABduction Weight (lbs) 3   External Rotation PROM;5 reps;Strengthening;10 reps   External Rotation Weight (lbs) 3   Internal Rotation PROM;5 reps;Strengthening;10 reps   Internal Rotation Weight (lbs) 3   Flexion PROM;5 reps;Strengthening;10 reps   Shoulder Flexion Weight (lbs) 3   ABduction PROM;5 reps;Strengthening;10 reps   Shoulder ABduction Weight (lbs) 3   Shoulder Exercises: Standing   Protraction Strengthening;10 reps   Protraction Weight (lbs) 3   Horizontal ABduction Strengthening;10 reps;Theraband;15 reps   Theraband Level (Shoulder Horizontal ABduction) Level 3 (Green)   Horizontal ABduction Weight (lbs) 3   External Rotation Strengthening;10 reps;Theraband;15 reps   Theraband Level (Shoulder External Rotation) Level 3 (Green)   External Rotation Weight (lbs) 3   Internal Rotation Strengthening;10 reps   Internal Rotation Weight (lbs) 3   Flexion Strengthening;10 reps   Shoulder Flexion Weight (lbs) 3   ABduction Strengthening;10 reps   Shoulder ABduction Weight (lbs) 2   Extension Theraband;15 reps   Theraband Level (Shoulder Extension) Level 3 (Green)   Row [employer] reps   Theraband Level (Shoulder Row) Level 3 (Green)   Retraction Theraband;15 reps   Theraband Level (Shoulder Retraction) Level 3 (Green)   Shoulder Exercises: ROM/Strengthening   UBE (Upper Arm Bike) Level 2 3' forward 3' reverse   Cybex Press 3 plate;15 reps   Cybex Row 2 plate;15 reps   Ball on Wall 1' flexion 1' abduction green ball   Manual Therapy   Manual Therapy Myofascial release   Manual therapy comments Manual therapy completed prior to exercises.   Myofascial Release  Myofascial release to right upper arm, deltoid, trapezius, and scapularis regions to decrease pain and fascial restrictions and increase joint range of motion. Myofascial release to cervical region and sternocleidomastoid region.  Scar massage/release to lateral healed surgical incision this date.                  OT Education - 03/19/15 1413    Education provided Yes   Education Details Patient given green theraband to upgrade HEP.   Person(s) Educated Patient   Methods Explanation;Verbal cues;Demonstration   Comprehension Verbalized  understanding;Returned demonstration          OT Short Term Goals - 03/08/15 1133    OT SHORT TERM GOAL #1   Title Patient will be educated and independent with HEP.   Time 6   Period Weeks   OT SHORT TERM GOAL #2   Title Patient will increase PROM to [facility] to increase ability to complete dressing tasks.    Time 6   Period Weeks   OT SHORT TERM GOAL #3   Title Patient will increase right shoulder strength to 3/5 to increase ability to hold onto light weight items.    Time 6   Period Weeks   OT SHORT TERM GOAL #4   Title Patient will decrease pain to 3/10 or less during daily tasks.   Time 6   Period Weeks   OT SHORT TERM GOAL #5   Title Patient will decrease fascial restrictions to mod amount.    Time 6   Period Weeks           OT Long Term Goals - 03/08/15 1133    OT LONG TERM GOAL #1   Title patient will return to highest level of independence with all daily, leisure, and work related tasks.    Time 12   Period Weeks   Status On-going   OT LONG TERM GOAL #2   Title Patient will increase AROM to WNL to increase ability to complete tasks above shoulder height and to be able to internally rotate to fasten her bra behind her back.   Time 12   Period Weeks   Status On-going   OT LONG TERM GOAL #3   Title Patient will increase right shoulder strength to 4+/5 to increase ability to return to work and perform required tasks.   Time 12   Period Weeks   Status On-going   OT LONG TERM GOAL #4   Title Patient will decrease pain level to 1/10 or less during daily tasks.   Time 12   Period Weeks   Status On-going   OT LONG TERM GOAL #5   Title Patient will decrease fascial restrictions to min amount in her right shoulder region in order to have increased mobility needed to complete daily activities.    Time 12   Period Weeks               Plan - 03/19/15 1428    Clinical Impression Statement A; Upgraded to 3# supine this session. patient was able to complete  most of standing exercises with 3# with the exception of abduction. VC for form and technique.   Plan P: Continue with weighted lifting box task.        Problem List There are no active problems to display for this patient.   Limmie Patricia, OTR/L,CBIS  [phone]  03/19/2015, 2:45 PM  [facility] [address] [place], [place], [address] Phone: [phone]  Fax:  [phone]  Name: Deborah Baker MRN: [id number] Date of Birth: 1965-12-13

## 2015-03-22 ENCOUNTER — Encounter (HOSPITAL_COMMUNITY): Payer: Self-pay

## 2015-03-23 ENCOUNTER — Encounter (HOSPITAL_COMMUNITY): Payer: Self-pay

## 2015-03-25 ENCOUNTER — Encounter (HOSPITAL_COMMUNITY): Payer: Self-pay | Admitting: Occupational Therapy

## 2015-03-25 ENCOUNTER — Telehealth (HOSPITAL_COMMUNITY): Payer: Self-pay | Admitting: Occupational Therapy

## 2015-03-25 NOTE — Telephone Encounter (Signed)
Called and left message for pt regarding no show today at 3:15. Reminded pt of next appt on Tuesday 2/28 at 2:30.   Ezra Sites, OTR/L  [phone] 03/25/2015

## 2015-03-29 ENCOUNTER — Encounter (HOSPITAL_COMMUNITY): Payer: Self-pay

## 2015-03-30 ENCOUNTER — Ambulatory Visit (HOSPITAL_COMMUNITY): Payer: Worker's Compensation | Admitting: Occupational Therapy

## 2015-03-30 ENCOUNTER — Encounter (HOSPITAL_COMMUNITY): Payer: Self-pay | Admitting: Occupational Therapy

## 2015-03-30 DIAGNOSIS — M25511 Pain in right shoulder: Secondary | ICD-10-CM

## 2015-03-30 DIAGNOSIS — R29898 Other symptoms and signs involving the musculoskeletal system: Secondary | ICD-10-CM | POA: Diagnosis not present

## 2015-03-30 DIAGNOSIS — M629 Disorder of muscle, unspecified: Secondary | ICD-10-CM

## 2015-03-30 DIAGNOSIS — M25611 Stiffness of right shoulder, not elsewhere classified: Secondary | ICD-10-CM

## 2015-03-30 DIAGNOSIS — M6289 Other specified disorders of muscle: Secondary | ICD-10-CM

## 2015-03-30 NOTE — Therapy (Signed)
[place] [facility] [address] [place], [place], [address] Phone: [phone]   Fax:  [phone]  Occupational Therapy Treatment  Patient Details  Name: Deborah Baker MRN: [id number] Date of Birth: [birth year] Referring Provider: Dr. Teryl Lucy, MD  Encounter Date: 03/30/2015      OT End of Session - 03/30/15 1621    Visit Number 39   Number of Visits 40   Date for OT Re-Evaluation 04/03/15   Authorization Type Worker's Comp   OT Start Time 1432   OT Stop Time 1517   OT Time Calculation (min) 45 min   Activity Tolerance Patient tolerated treatment well   Behavior During Therapy [facility] for tasks assessed/performed      Past Medical History  Diagnosis Date  . Hypertension   . Outbursts of anger   . Hyperlipemia     Past Surgical History  Procedure Laterality Date  . Rotator cuff repair      There were no vitals filed for this visit.  Visit Diagnosis:  Shoulder stiffness, right  Shoulder weakness  Tight fascia  Right shoulder pain      Subjective Assessment - 03/30/15 1433    Subjective  S: I think I did too much yesterday, I'm really tight.    Currently in Pain? No/denies                      OT Treatments/Exercises (OP) - 03/30/15 1453    Exercises   Exercises Shoulder   Shoulder Exercises: Supine   Protraction PROM;5 reps;Strengthening;10 reps   Protraction Weight (lbs) 3   Horizontal ABduction PROM;5 reps;Strengthening;10 reps   Horizontal ABduction Weight (lbs) 3   External Rotation PROM;5 reps;Strengthening;10 reps   External Rotation Weight (lbs) 3   Internal Rotation PROM;5 reps;Strengthening;10 reps   Internal Rotation Weight (lbs) 3   Flexion PROM;5 reps;Strengthening;10 reps   Shoulder Flexion Weight (lbs) 3   ABduction PROM;5 reps;Strengthening;10 reps   Shoulder ABduction Weight (lbs) 3   Shoulder Exercises: Standing   Protraction Strengthening;10 reps   Protraction Weight (lbs)  3   Horizontal ABduction Strengthening;10 reps   Horizontal ABduction Weight (lbs) 3   External Rotation Strengthening;10 reps   External Rotation Weight (lbs) 3   Internal Rotation Strengthening;10 reps   Internal Rotation Weight (lbs) 3   Flexion Strengthening;10 reps   Shoulder Flexion Weight (lbs) 3   ABduction Strengthening;10 reps   Shoulder Exercises: ROM/Strengthening   Cybex Press 3 plate;15 reps   Cybex Row 3 plate;15 reps   Other ROM/Strengthening Exercises Pt completed overhead lifting task this session, lifting weighted box from waist level to shelf overhead. 10#, 10X. Mod fatigue noted near end of task.    Manual Therapy   Manual Therapy Myofascial release   Manual therapy comments Manual therapy completed prior to exercises.   Myofascial Release Myofascial release to right upper arm, deltoid, trapezius, and scapularis regions to decrease pain and fascial restrictions and increase joint range of motion. Myofascial release to cervical region and sternocleidomastoid region.  Scar massage/release to lateral healed surgical incision this date.                    OT Short Term Goals - 03/08/15 1133    OT SHORT TERM GOAL #1   Title Patient will be educated and independent with HEP.   Time 6   Period Weeks   OT SHORT TERM GOAL #2   Title Patient will  increase PROM to [facility] to increase ability to complete dressing tasks.    Time 6   Period Weeks   OT SHORT TERM GOAL #3   Title Patient will increase right shoulder strength to 3/5 to increase ability to hold onto light weight items.    Time 6   Period Weeks   OT SHORT TERM GOAL #4   Title Patient will decrease pain to 3/10 or less during daily tasks.   Time 6   Period Weeks   OT SHORT TERM GOAL #5   Title Patient will decrease fascial restrictions to mod amount.    Time 6   Period Weeks           OT Long Term Goals - 03/08/15 1133    OT LONG TERM GOAL #1   Title patient will return to highest level of  independence with all daily, leisure, and work related tasks.    Time 12   Period Weeks   Status On-going   OT LONG TERM GOAL #2   Title Patient will increase AROM to WNL to increase ability to complete tasks above shoulder height and to be able to internally rotate to fasten her bra behind her back.   Time 12   Period Weeks   Status On-going   OT LONG TERM GOAL #3   Title Patient will increase right shoulder strength to 4+/5 to increase ability to return to work and perform required tasks.   Time 12   Period Weeks   Status On-going   OT LONG TERM GOAL #4   Title Patient will decrease pain level to 1/10 or less during daily tasks.   Time 12   Period Weeks   Status On-going   OT LONG TERM GOAL #5   Title Patient will decrease fascial restrictions to min amount in her right shoulder region in order to have increased mobility needed to complete daily activities.    Time 12   Period Weeks               Plan - 03/30/15 1621    Clinical Impression Statement A: Continued weighted lifting task, pt with mod fatigue at end of 10 reps. Pt with increased tightness and discomfort this session, reports she was lifting boards yesterday and believes she aggravated her shoulder. Verbal cuing for form during exercises.    Plan P: Reassess        Problem List There are no active problems to display for this patient.   Ezra Sites, OTR/L  [phone]  03/30/2015, 4:23 PM  [place] [facility] [address] [place], [place], [address] Phone: [phone]   Fax:  [phone]  Name: Deborah Baker MRN: [id number] Date of Birth: 08/03/1965

## 2015-04-01 ENCOUNTER — Ambulatory Visit (HOSPITAL_COMMUNITY): Payer: Worker's Compensation | Attending: Orthopedic Surgery

## 2015-04-01 ENCOUNTER — Encounter (HOSPITAL_COMMUNITY): Payer: Self-pay

## 2015-04-01 DIAGNOSIS — M25611 Stiffness of right shoulder, not elsewhere classified: Secondary | ICD-10-CM | POA: Insufficient documentation

## 2015-04-01 DIAGNOSIS — M6289 Other specified disorders of muscle: Secondary | ICD-10-CM

## 2015-04-01 DIAGNOSIS — M629 Disorder of muscle, unspecified: Secondary | ICD-10-CM | POA: Diagnosis present

## 2015-04-01 DIAGNOSIS — R29898 Other symptoms and signs involving the musculoskeletal system: Secondary | ICD-10-CM | POA: Diagnosis present

## 2015-04-01 NOTE — Patient Instructions (Signed)
Strengthening: Chest Pull - Resisted   Hold Theraband in front of body with hands about shoulder width a part. Pull band a part and back together slowly. Repeat _12-15___ times. Complete __1__ set(s) per session.. Repeat __2__ session(s) per day.  http://orth.exer.us/926   Copyright  VHI. All rights reserved.   PNF Strengthening: Resisted   Standing with resistive band around each hand, bring right arm up and away, thumb back. Repeat __12-15__ times per set. Do __1__ sets per session. Do __2__ sessions per day.   Resisted External Rotation: in Neutral - Bilateral   Sit or stand, tubing in both hands, elbows at sides, bent to 90, forearms forward. Pinch shoulder blades together and rotate forearms out. Keep elbows at sides. Repeat __12-15__ times per set. Do __1__ sets per session. Do _2___ sessions per day.  http://orth.exer.us/966   Copyright  VHI. All rights reserved.   PNF Strengthening: Resisted   Standing, hold resistive band above head. Bring right arm down and out from side. Repeat _12-15___ times per set. Do _1___ sets per session. Do _2___ sessions per day.  http://orth.exer.us/922   Copyright  VHI. All rights reserved  REVERSE PENDULUMS  Lying on your back, straighten your arm towards the ceiling. Next, move your arm in small circles in a clock-wise motion.  After a few seconds, reverse the direction to a counter-clockwise motion. Change directions every few seconds.    Spider Walks  Start: As pictured with the Theraband around your wrists arms shoulder width apart, with your hands on the wall.  Movement: While maintaining one arm on the wall place the opposite arm out to the side and on the wall, repeat with the other arm. Then, control the movement to put each arm back into the starting position, this is one repetition. Now repeat the movement.  *Note-keep your hands on the wall at chest height, elbow should not be flexed, control the movement out as  well as in (don't let the band pull you in)     Theraband Wall Walk  Place forearms on flat against the wall with an Isoband around the wrists. Arm should form a 90 degree angle with the wall and your shoulder. Move one arm out to the side, and bring the other arm into it. Continue down the wall with same arm, and switch on the way back.

## 2015-04-01 NOTE — Therapy (Signed)
[place] [facility] [address] [place], [place], [address] Phone: [phone]   Fax:  [phone]  Occupational Therapy Treatment And reassessment Patient Details  Name: Deborah Baker MRN: [id number] Date of Birth: [birth year] Referring Provider: Dr. Teryl Lucy, MD  Encounter Date: 04/01/2015      OT End of Session - 04/01/15 1549    Visit Number 40   Number of Visits 40   Authorization Type Worker's Comp   OT Start Time 1440   OT Stop Time 1534   OT Time Calculation (min) 54 min   Activity Tolerance Patient tolerated treatment well   Behavior During Therapy [facility] for tasks assessed/performed      Past Medical History  Diagnosis Date  . Hypertension   . Outbursts of anger   . Hyperlipemia     Past Surgical History  Procedure Laterality Date  . Rotator cuff repair      There were no vitals filed for this visit.  Visit Diagnosis:  Shoulder stiffness, right  Shoulder weakness  Tight fascia      Subjective Assessment - 04/01/15 1546    Subjective  S: I feell like my strength just isn't there at times.   Special Tests FOTO score: 72/100 (28% impaired)   Currently in Pain? No/denies            [facility] OT Assessment - 04/01/15 1457    Assessment   Diagnosis Right rotator cuff repair   Precautions   Precautions Shoulder   Type of Shoulder Precautions progress as tolerated   AROM   Overall AROM Comments assessed in seated, er with shoulder adducted and IR with shoulder abducted to 90   AROM Assessment Site Shoulder   Right/Left Shoulder Right   Right Shoulder Flexion 170 Degrees  previous: 162   Right Shoulder ABduction 180 Degrees  previous: 170   Right Shoulder Internal Rotation 90 Degrees  previous: 90   Right Shoulder External Rotation 90 Degrees  previous: 80   Strength   Overall Strength Comments Assessed seated. ER/ir adducted.    Strength Assessment Site Shoulder   Right/Left Shoulder Right   Right  Shoulder Flexion 4/5  previous: 4+/5   Right Shoulder ABduction 4-/5  previous; 4+/5   Right Shoulder Internal Rotation 5/5  previous: 4/5   Right Shoulder External Rotation 4/5  previous: 4/5                  OT Treatments/Exercises (OP) - 04/01/15 1547    Exercises   Exercises Shoulder   Shoulder Exercises: Supine   Protraction PROM;10 reps   Horizontal ABduction PROM;10 reps   External Rotation PROM;10 reps   Internal Rotation PROM;10 reps   Flexion PROM;10 reps   ABduction PROM;10 reps   Shoulder Exercises: Standing   Horizontal ABduction Theraband;15 reps   Theraband Level (Shoulder Horizontal ABduction) Level 4 (Blue)   External Rotation Theraband;15 reps   Theraband Level (Shoulder External Rotation) Level 4 (Blue)   Internal Rotation Theraband;15 reps   Theraband Level (Shoulder Internal Rotation) Level 4 (Blue)   ABduction Theraband;15 reps   Theraband Level (Shoulder ABduction) Level 4 (Blue)   Other Standing Exercises Wall walks and spider walks; red band; 12X   Manual Therapy   Manual Therapy Myofascial release   Manual therapy comments Manual therapy completed prior to exercises.   Myofascial Release Myofascial release to right upper arm, deltoid, trapezius, and scapularis regions to decrease pain and fascial restrictions and increase joint  range of motion. Myofascial release to cervical region and sternocleidomastoid region.  Scar massage/release to lateral healed surgical incision this date.                    OT Short Term Goals - 03/08/15 1133    OT SHORT TERM GOAL #1   Title Patient will be educated and independent with HEP.   Time 6   Period Weeks   OT SHORT TERM GOAL #2   Title Patient will increase PROM to [facility] to increase ability to complete dressing tasks.    Time 6   Period Weeks   OT SHORT TERM GOAL #3   Title Patient will increase right shoulder strength to 3/5 to increase ability to hold onto light weight items.    Time 6    Period Weeks   OT SHORT TERM GOAL #4   Title Patient will decrease pain to 3/10 or less during daily tasks.   Time 6   Period Weeks   OT SHORT TERM GOAL #5   Title Patient will decrease fascial restrictions to mod amount.    Time 6   Period Weeks           OT Long Term Goals - 04/01/15 1504    OT LONG TERM GOAL #1   Title patient will return to highest level of independence with all daily, leisure, and work related tasks.    Time 12   Period Weeks   Status Partially Met   OT LONG TERM GOAL #2   Title Patient will increase AROM to WNL to increase ability to complete tasks above shoulder height and to be able to internally rotate to fasten her bra behind her back.   Time 12   Period Weeks   Status Achieved   OT LONG TERM GOAL #3   Title Patient will increase right shoulder strength to 4+/5 to increase ability to return to work and perform required tasks.   Time 12   Period Weeks   Status Partially Met   OT LONG TERM GOAL #4   Title Patient will decrease pain level to 1/10 or less during daily tasks.   Time 12   Period Weeks   Status Achieved   OT LONG TERM GOAL #5   Title Patient will decrease fascial restrictions to min amount in her right shoulder region in order to have increased mobility needed to complete daily activities.    Time 12   Period Weeks               Plan - 04/01/15 1549    Clinical Impression Statement A: Reassessment completed this date. patient met all goals expect two which she has partially met. Goals pertaining to strength and returning to work tasks. Patient reports that she feels like her strength is the worst when she is reaching out versis holding items close. Pt was given an updated HEP and is in agreement with discharge.    Plan P: D/C from therapy with HEP.     OCCUPATIONAL THERAPY DISCHARGE SUMMARY  Visits from Start of Care: 40  Current functional level related to goals / functional outcomes: See above   Remaining  deficits: See above   Education / Equipment: Scapular and shoulder strengthening, self myofascial release, red, green, and blue theraband. Plan: Patient agrees to discharge.  Patient goals were met. Patient is being discharged due to meeting the stated rehab goals.  ?????        Problem  List There are no active problems to display for this patient.   Ailene Ravel, OTR/L,CBIS  [phone]  04/01/2015, 3:57 PM  [place] [address] [place], [place], [address] Phone: [phone]   Fax:  [phone]  Name: Deborah Baker MRN: [id number] Date of Birth: [birth year]

## 2015-04-05 ENCOUNTER — Encounter (HOSPITAL_COMMUNITY): Payer: Self-pay

## 2016-11-27 DIAGNOSIS — I10 Essential (primary) hypertension: Secondary | ICD-10-CM | POA: Diagnosis not present

## 2017-01-01 DIAGNOSIS — E0590 Thyrotoxicosis, unspecified without thyrotoxic crisis or storm: Secondary | ICD-10-CM | POA: Diagnosis not present

## 2017-01-01 DIAGNOSIS — E069 Thyroiditis, unspecified: Secondary | ICD-10-CM | POA: Diagnosis not present

## 2017-02-12 DIAGNOSIS — R252 Cramp and spasm: Secondary | ICD-10-CM | POA: Diagnosis not present

## 2017-06-12 DIAGNOSIS — J019 Acute sinusitis, unspecified: Secondary | ICD-10-CM | POA: Diagnosis not present

## 2017-06-12 DIAGNOSIS — Z6841 Body Mass Index (BMI) 40.0 and over, adult: Secondary | ICD-10-CM | POA: Diagnosis not present

## 2017-09-04 ENCOUNTER — Other Ambulatory Visit: Payer: Self-pay | Admitting: Internal Medicine

## 2017-09-04 DIAGNOSIS — Z1231 Encounter for screening mammogram for malignant neoplasm of breast: Secondary | ICD-10-CM

## 2017-09-17 ENCOUNTER — Ambulatory Visit (HOSPITAL_COMMUNITY)
Admission: RE | Admit: 2017-09-17 | Discharge: 2017-09-17 | Disposition: A | Discharge status: Home / Self Care | Payer: BLUE CROSS/BLUE SHIELD | Source: Ambulatory Visit | Attending: Internal Medicine | Admitting: Internal Medicine

## 2017-09-17 ENCOUNTER — Encounter (HOSPITAL_COMMUNITY): Payer: Self-pay

## 2017-09-17 DIAGNOSIS — Z1231 Encounter for screening mammogram for malignant neoplasm of breast: Secondary | ICD-10-CM | POA: Insufficient documentation

## 2017-10-10 DIAGNOSIS — R079 Chest pain, unspecified: Secondary | ICD-10-CM | POA: Diagnosis not present

## 2017-10-10 DIAGNOSIS — Z6841 Body Mass Index (BMI) 40.0 and over, adult: Secondary | ICD-10-CM | POA: Diagnosis not present

## 2017-10-10 DIAGNOSIS — R252 Cramp and spasm: Secondary | ICD-10-CM | POA: Diagnosis not present

## 2017-10-10 DIAGNOSIS — I10 Essential (primary) hypertension: Secondary | ICD-10-CM | POA: Diagnosis not present

## 2017-10-10 DIAGNOSIS — E079 Disorder of thyroid, unspecified: Secondary | ICD-10-CM | POA: Diagnosis not present

## 2017-10-10 DIAGNOSIS — J019 Acute sinusitis, unspecified: Secondary | ICD-10-CM | POA: Diagnosis not present

## 2017-10-16 DIAGNOSIS — R7301 Impaired fasting glucose: Secondary | ICD-10-CM | POA: Diagnosis not present

## 2017-10-16 DIAGNOSIS — I10 Essential (primary) hypertension: Secondary | ICD-10-CM | POA: Diagnosis not present

## 2017-10-16 DIAGNOSIS — E782 Mixed hyperlipidemia: Secondary | ICD-10-CM | POA: Diagnosis not present

## 2017-10-16 DIAGNOSIS — Z0000 Encounter for general adult medical examination without abnormal findings: Secondary | ICD-10-CM | POA: Diagnosis not present

## 2017-10-26 DIAGNOSIS — R079 Chest pain, unspecified: Secondary | ICD-10-CM | POA: Diagnosis not present

## 2017-12-30 ENCOUNTER — Encounter (HOSPITAL_COMMUNITY): Payer: Self-pay | Admitting: *Deleted

## 2017-12-30 ENCOUNTER — Emergency Department (HOSPITAL_COMMUNITY): Payer: BLUE CROSS/BLUE SHIELD

## 2017-12-30 ENCOUNTER — Emergency Department (HOSPITAL_COMMUNITY)
Admission: EM | Admit: 2017-12-30 | Discharge: 2017-12-30 | Disposition: A | Discharge status: Home / Self Care | Payer: BLUE CROSS/BLUE SHIELD | Attending: Emergency Medicine | Admitting: Emergency Medicine

## 2017-12-30 DIAGNOSIS — S60221A Contusion of right hand, initial encounter: Secondary | ICD-10-CM | POA: Diagnosis not present

## 2017-12-30 DIAGNOSIS — Z79899 Other long term (current) drug therapy: Secondary | ICD-10-CM | POA: Diagnosis not present

## 2017-12-30 DIAGNOSIS — W2209XA Striking against other stationary object, initial encounter: Secondary | ICD-10-CM | POA: Insufficient documentation

## 2017-12-30 DIAGNOSIS — K029 Dental caries, unspecified: Secondary | ICD-10-CM | POA: Diagnosis not present

## 2017-12-30 DIAGNOSIS — Y92009 Unspecified place in unspecified non-institutional (private) residence as the place of occurrence of the external cause: Secondary | ICD-10-CM | POA: Diagnosis not present

## 2017-12-30 DIAGNOSIS — Z87891 Personal history of nicotine dependence: Secondary | ICD-10-CM | POA: Insufficient documentation

## 2017-12-30 DIAGNOSIS — Y9389 Activity, other specified: Secondary | ICD-10-CM | POA: Diagnosis not present

## 2017-12-30 DIAGNOSIS — Y999 Unspecified external cause status: Secondary | ICD-10-CM | POA: Insufficient documentation

## 2017-12-30 DIAGNOSIS — I10 Essential (primary) hypertension: Secondary | ICD-10-CM | POA: Diagnosis not present

## 2017-12-30 DIAGNOSIS — S6991XA Unspecified injury of right wrist, hand and finger(s), initial encounter: Secondary | ICD-10-CM | POA: Diagnosis not present

## 2017-12-30 DIAGNOSIS — M79641 Pain in right hand: Secondary | ICD-10-CM

## 2017-12-30 MED ORDER — IBUPROFEN 600 MG PO TABS: 600.0000 mg | ORAL_TABLET | Freq: Four times a day (QID) | ORAL | 0 refills | Status: AC | PRN

## 2017-12-30 MED ORDER — AMOXICILLIN 500 MG PO CAPS: 500.0000 mg | ORAL_CAPSULE | Freq: Three times a day (TID) | ORAL | 0 refills | Status: DC

## 2017-12-30 MED ORDER — TRAMADOL HCL 50 MG PO TABS: ORAL_TABLET | ORAL | 0 refills | Status: DC

## 2017-12-30 NOTE — Discharge Instructions (Signed)
Your examination is negative for any neurological vascular deficits.  Your x-ray is negative for fracture or dislocation.  Please use ice pack to your hand today and tomorrow.  Please use 600 mg of ibuprofen with breakfast, lunch, dinner, and at bedtime.  May use Ultram for more severe pain. This medication may cause drowsiness. Please do not drink, drive, or participate in activity that requires concentration while taking this medication.  Please see Dr.Harrison for additional evaluation and management if not improving.

## 2017-12-30 NOTE — ED Notes (Signed)
While HB in room discussing findings, pt asked to have dental px evaluated.   Additional exam by HB
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
## 2017-12-30 NOTE — ED Notes (Signed)
To Rad 
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
## 2018-03-08 DIAGNOSIS — J09X2 Influenza due to identified novel influenza A virus with other respiratory manifestations: Secondary | ICD-10-CM | POA: Diagnosis not present

## 2018-05-07 DIAGNOSIS — F331 Major depressive disorder, recurrent, moderate: Secondary | ICD-10-CM | POA: Diagnosis not present

## 2018-05-07 DIAGNOSIS — F411 Generalized anxiety disorder: Secondary | ICD-10-CM | POA: Diagnosis not present

## 2018-05-07 DIAGNOSIS — I10 Essential (primary) hypertension: Secondary | ICD-10-CM | POA: Diagnosis not present

## 2018-05-07 DIAGNOSIS — E0590 Thyrotoxicosis, unspecified without thyrotoxic crisis or storm: Secondary | ICD-10-CM | POA: Diagnosis not present

## 2018-07-19 DIAGNOSIS — R7301 Impaired fasting glucose: Secondary | ICD-10-CM | POA: Diagnosis not present

## 2018-07-19 DIAGNOSIS — I10 Essential (primary) hypertension: Secondary | ICD-10-CM | POA: Diagnosis not present

## 2018-07-19 DIAGNOSIS — E782 Mixed hyperlipidemia: Secondary | ICD-10-CM | POA: Diagnosis not present

## 2018-08-06 DIAGNOSIS — Z0001 Encounter for general adult medical examination with abnormal findings: Secondary | ICD-10-CM | POA: Diagnosis not present

## 2018-08-06 DIAGNOSIS — I10 Essential (primary) hypertension: Secondary | ICD-10-CM | POA: Diagnosis not present

## 2018-08-06 DIAGNOSIS — E782 Mixed hyperlipidemia: Secondary | ICD-10-CM | POA: Diagnosis not present

## 2018-08-06 DIAGNOSIS — R7301 Impaired fasting glucose: Secondary | ICD-10-CM | POA: Diagnosis not present

## 2018-08-20 DIAGNOSIS — D485 Neoplasm of uncertain behavior of skin: Secondary | ICD-10-CM | POA: Diagnosis not present

## 2018-08-20 DIAGNOSIS — Z1283 Encounter for screening for malignant neoplasm of skin: Secondary | ICD-10-CM | POA: Diagnosis not present

## 2018-08-20 DIAGNOSIS — D2361 Other benign neoplasm of skin of right upper limb, including shoulder: Secondary | ICD-10-CM | POA: Diagnosis not present

## 2018-08-20 DIAGNOSIS — D225 Melanocytic nevi of trunk: Secondary | ICD-10-CM | POA: Diagnosis not present

## 2018-08-20 DIAGNOSIS — L905 Scar conditions and fibrosis of skin: Secondary | ICD-10-CM | POA: Diagnosis not present

## 2018-12-06 DIAGNOSIS — F331 Major depressive disorder, recurrent, moderate: Secondary | ICD-10-CM | POA: Diagnosis not present

## 2018-12-06 DIAGNOSIS — Z0000 Encounter for general adult medical examination without abnormal findings: Secondary | ICD-10-CM | POA: Diagnosis not present

## 2018-12-06 DIAGNOSIS — I10 Essential (primary) hypertension: Secondary | ICD-10-CM | POA: Diagnosis not present

## 2018-12-06 DIAGNOSIS — R7303 Prediabetes: Secondary | ICD-10-CM | POA: Diagnosis not present

## 2018-12-06 DIAGNOSIS — G2581 Restless legs syndrome: Secondary | ICD-10-CM | POA: Diagnosis not present

## 2018-12-06 DIAGNOSIS — E782 Mixed hyperlipidemia: Secondary | ICD-10-CM | POA: Diagnosis not present

## 2018-12-12 DIAGNOSIS — I10 Essential (primary) hypertension: Secondary | ICD-10-CM | POA: Diagnosis not present

## 2018-12-12 DIAGNOSIS — R42 Dizziness and giddiness: Secondary | ICD-10-CM | POA: Diagnosis not present

## 2018-12-12 DIAGNOSIS — Z87891 Personal history of nicotine dependence: Secondary | ICD-10-CM | POA: Diagnosis not present

## 2018-12-12 DIAGNOSIS — S060X0A Concussion without loss of consciousness, initial encounter: Secondary | ICD-10-CM | POA: Diagnosis not present

## 2018-12-12 DIAGNOSIS — F329 Major depressive disorder, single episode, unspecified: Secondary | ICD-10-CM | POA: Diagnosis not present

## 2018-12-12 DIAGNOSIS — S0990XA Unspecified injury of head, initial encounter: Secondary | ICD-10-CM | POA: Diagnosis not present

## 2018-12-12 DIAGNOSIS — Z79899 Other long term (current) drug therapy: Secondary | ICD-10-CM | POA: Diagnosis not present

## 2018-12-12 DIAGNOSIS — W108XXA Fall (on) (from) other stairs and steps, initial encounter: Secondary | ICD-10-CM | POA: Diagnosis not present

## 2018-12-16 DIAGNOSIS — H539 Unspecified visual disturbance: Secondary | ICD-10-CM | POA: Diagnosis not present

## 2018-12-16 DIAGNOSIS — S060X0A Concussion without loss of consciousness, initial encounter: Secondary | ICD-10-CM | POA: Diagnosis not present

## 2019-02-25 DIAGNOSIS — U071 COVID-19: Secondary | ICD-10-CM | POA: Diagnosis not present

## 2019-03-07 DIAGNOSIS — U071 COVID-19: Secondary | ICD-10-CM | POA: Diagnosis not present

## 2019-05-13 ENCOUNTER — Other Ambulatory Visit: Payer: Self-pay | Admitting: Internal Medicine

## 2019-05-13 ENCOUNTER — Other Ambulatory Visit (HOSPITAL_COMMUNITY): Payer: Self-pay | Admitting: Internal Medicine

## 2019-05-13 DIAGNOSIS — R2242 Localized swelling, mass and lump, left lower limb: Secondary | ICD-10-CM | POA: Diagnosis not present

## 2019-05-14 ENCOUNTER — Ambulatory Visit (HOSPITAL_COMMUNITY): Payer: BLUE CROSS/BLUE SHIELD

## 2019-05-15 ENCOUNTER — Ambulatory Visit (HOSPITAL_COMMUNITY): Admission: RE | Admit: 2019-05-15 | Payer: BC Managed Care – PPO | Source: Ambulatory Visit

## 2019-05-20 ENCOUNTER — Encounter (HOSPITAL_COMMUNITY): Payer: Self-pay

## 2019-05-20 ENCOUNTER — Ambulatory Visit (HOSPITAL_COMMUNITY): Payer: BC Managed Care – PPO

## 2019-09-04 DIAGNOSIS — E782 Mixed hyperlipidemia: Secondary | ICD-10-CM | POA: Diagnosis not present

## 2019-09-04 DIAGNOSIS — I10 Essential (primary) hypertension: Secondary | ICD-10-CM | POA: Diagnosis not present

## 2019-09-04 DIAGNOSIS — R7301 Impaired fasting glucose: Secondary | ICD-10-CM | POA: Diagnosis not present

## 2019-09-04 DIAGNOSIS — R7303 Prediabetes: Secondary | ICD-10-CM | POA: Diagnosis not present

## 2019-09-04 DIAGNOSIS — E0590 Thyrotoxicosis, unspecified without thyrotoxic crisis or storm: Secondary | ICD-10-CM | POA: Diagnosis not present

## 2019-10-03 DIAGNOSIS — F331 Major depressive disorder, recurrent, moderate: Secondary | ICD-10-CM | POA: Diagnosis not present

## 2019-10-03 DIAGNOSIS — I10 Essential (primary) hypertension: Secondary | ICD-10-CM | POA: Diagnosis not present

## 2019-10-03 DIAGNOSIS — R7301 Impaired fasting glucose: Secondary | ICD-10-CM | POA: Diagnosis not present

## 2019-10-03 DIAGNOSIS — E782 Mixed hyperlipidemia: Secondary | ICD-10-CM | POA: Diagnosis not present

## 2020-02-23 ENCOUNTER — Other Ambulatory Visit: Payer: BC Managed Care – PPO | Admitting: Obstetrics & Gynecology

## 2020-04-13 ENCOUNTER — Other Ambulatory Visit: Payer: Self-pay

## 2020-04-13 ENCOUNTER — Encounter: Payer: Self-pay | Admitting: Emergency Medicine

## 2020-04-13 ENCOUNTER — Ambulatory Visit
Admission: EM | Admit: 2020-04-13 | Discharge: 2020-04-13 | Disposition: A | Discharge status: Home / Self Care | Payer: 59 | Attending: Emergency Medicine | Admitting: Emergency Medicine

## 2020-04-13 DIAGNOSIS — J0140 Acute pansinusitis, unspecified: Secondary | ICD-10-CM

## 2020-04-13 HISTORY — DX: Disorder of thyroid, unspecified: E07.9

## 2020-04-13 MED ORDER — AMOXICILLIN-POT CLAVULANATE 875-125 MG PO TABS: 1.0000 | ORAL_TABLET | Freq: Two times a day (BID) | ORAL | 0 refills | Status: AC

## 2020-04-13 NOTE — Discharge Instructions (Signed)
Get plenty of rest and push fluids Augmentin prescribed for sinus infection Use OTC zyrtec for nasal congestion, runny nose, and/or sore throat Use OTC flonase for nasal congestion and runny nose Use medications daily for symptom relief Use OTC medications like ibuprofen or tylenol as needed fever or pain Call or go to the ED if you have any new or worsening symptoms such as fever, cough, shortness of breath, chest tightness, chest pain, turning blue, changes in mental status, etc..Marland Kitchen

## 2020-04-13 NOTE — ED Triage Notes (Signed)
Headache, sneezing, feels tires, diarrhea x 3 days.  States she took a home covid test that was negative.

## 2020-04-13 NOTE — ED Provider Notes (Signed)
[facility]   [id number] 04/13/20 Arrival Time: 1519   CC: "sinus infection"  SUBJECTIVE: History from: patient.  Deborah Baker is a 55 y.o. female who presents with sinus pain, pressure, congestion, headache, fatigue and diarrhea x 5 days.  Denies sick exposure to COVID, flu or strep.  Has tried OTC medications without relief.  Denies aggravating factors.  Reports previous symptoms in the past with sinus infection.  Took home covid test and was negative.  Received covid vaccines.   Denies fever, chills, SOB, wheezing, chest pain, nausea, changes in bowel or bladder habits.    ROS: As per HPI.  All other pertinent ROS negative.     Past Medical History:  Diagnosis Date  . Hyperlipemia   . Hypertension   . Outbursts of anger   . Thyroid disease    Past Surgical History:  Procedure Laterality Date  . ROTATOR CUFF REPAIR     Allergies  Allergen Reactions  . Sulfa Antibiotics Nausea Only    States only allergic reaction when mixed with anti-histamine    No current facility-administered medications on file prior to encounter.   Current Outpatient Medications on File Prior to Encounter  Medication Sig Dispense Refill  . alum hydroxide-mag trisilicate (GAVISCON) 80-20 MG CHEW chewable tablet Chew 1 tablet by mouth 3 (three) times daily as needed for indigestion or heartburn.    . Aspirin-Acetaminophen-Caffeine (GOODY HEADACHE PO) Take 1 packet by mouth daily as needed (for pain).    . Azilsartan-Chlorthalidone (EDARBYCLOR) 40-25 MG TABS Take 1 tablet by mouth daily.    . B Complex Vitamins (B COMPLEX-B12 PO) Take 1 tablet by mouth daily.    Marland Kitchen docusate sodium (COLACE) 100 MG capsule Take 100 mg by mouth daily as needed for mild constipation.    Marland Kitchen escitalopram (LEXAPRO) 20 MG tablet Take 20 mg by mouth daily.     Marland Kitchen ibuprofen (ADVIL,MOTRIN) 600 MG tablet Take 1 tablet (600 mg total) by mouth every 6 (six) hours as needed. 30 tablet 0  . LORazepam (ATIVAN) 0.5 MG tablet  Take 0.5 mg by mouth every 8 (eight) hours.    . lovastatin (MEVACOR) 20 MG tablet Take 20 mg by mouth 2 (two) times daily.    . methocarbamol (ROBAXIN) 500 MG tablet Take 500 mg by mouth 4 (four) times daily.    . montelukast (SINGULAIR) 10 MG tablet Take 10 mg by mouth daily.     Marland Kitchen omeprazole (PRILOSEC) 20 MG capsule Take 20 mg by mouth daily.    Marland Kitchen oxyCODONE-acetaminophen (PERCOCET/ROXICET) 5-325 MG per tablet Take by mouth every 4 (four) hours as needed for severe pain.    . traMADol (ULTRAM) 50 MG tablet 1 or 2 po q6h prn pain 12 tablet 0  . [DISCONTINUED] albuterol (PROVENTIL HFA;VENTOLIN HFA) 108 (90 BASE) MCG/ACT inhaler Inhale 1-2 puffs into the lungs every 6 (six) hours as needed for wheezing or shortness of breath. (Patient not taking: Reported on 10/02/2014) 1 Inhaler 0   Social History   Socioeconomic History  . Marital status: Single    Spouse name: Not on file  . Number of children: Not on file  . Years of education: Not on file  . Highest education level: Not on file  Occupational History  . Not on file  Tobacco Use  . Smoking status: Former Games developer  . Smokeless tobacco: Never Used  Vaping Use  . Vaping Use: Never used  Substance and Sexual Activity  . Alcohol use: Yes  Comment: occ.   . Drug use: No  . Sexual activity: Yes    Birth control/protection: None  Other Topics Concern  . Not on file  Social History Narrative  . Not on file   Social Determinants of Health   Financial Resource Strain: Not on file  Food Insecurity: Not on file  Transportation Needs: Not on file  Physical Activity: Not on file  Stress: Not on file  Social Connections: Not on file  Intimate Partner Violence: Not on file   Family History  Problem Relation Age of Onset  . Breast cancer Maternal Grandmother     OBJECTIVE:  Vitals:   04/13/20 1531  BP: (!) 155/83  Pulse: 80  Resp: 18  Temp: 98 F (36.7 C)  TempSrc: Oral  SpO2: 95%     General appearance: alert; appears  fatigued, but nontoxic; speaking in full sentences and tolerating own secretions HEENT: NCAT; Ears: EACs clear, TMs pearly gray; Eyes: PERRL.  EOM grossly intact. Sinuses: TTP; Nose: nares patent without rhinorrhea, Throat: oropharynx clear, tonsils non erythematous or enlarged, uvula midline  Neck: supple without LAD Lungs: unlabored respirations, symmetrical air entry; cough: absent; no respiratory distress; CTAB Heart: regular rate and rhythm.  Skin: warm and dry Psychological: alert and cooperative; normal mood and affect  ASSESSMENT & PLAN:  1. Acute non-recurrent pansinusitis     Meds ordered this encounter  Medications  . amoxicillin-clavulanate (AUGMENTIN) 875-125 MG tablet    Sig: Take 1 tablet by mouth every 12 (twelve) hours for 10 days.    Dispense:  20 tablet    Refill:  0    Order Specific Question:   Supervising Provider    Answer:   Eustace Moore [[id number]]   Get plenty of rest and push fluids Augmentin prescribed for sinus infection Use OTC zyrtec for nasal congestion, runny nose, and/or sore throat Use OTC flonase for nasal congestion and runny nose Use medications daily for symptom relief Use OTC medications like ibuprofen or tylenol as needed fever or pain Call or go to the ED if you have any new or worsening symptoms such as fever, cough, shortness of breath, chest tightness, chest pain, turning blue, changes in mental status, etc...   Reviewed expectations re: course of current medical issues. Questions answered. Outlined signs and symptoms indicating need for more acute intervention. Patient verbalized understanding. After Visit Summary given.         Rennis Harding, PA-C 04/13/20 1545

## 2020-07-24 IMAGING — MG DIGITAL SCREENING BILATERAL MAMMOGRAM WITH TOMO AND CAD
2 series · 3 of 6 positions shown · non-contrast
Comparison: None.

CLINICAL DATA: Screening.

EXAM:
DIGITAL SCREENING BILATERAL MAMMOGRAM WITH TOMO AND CAD

[L MLO synth-2D]
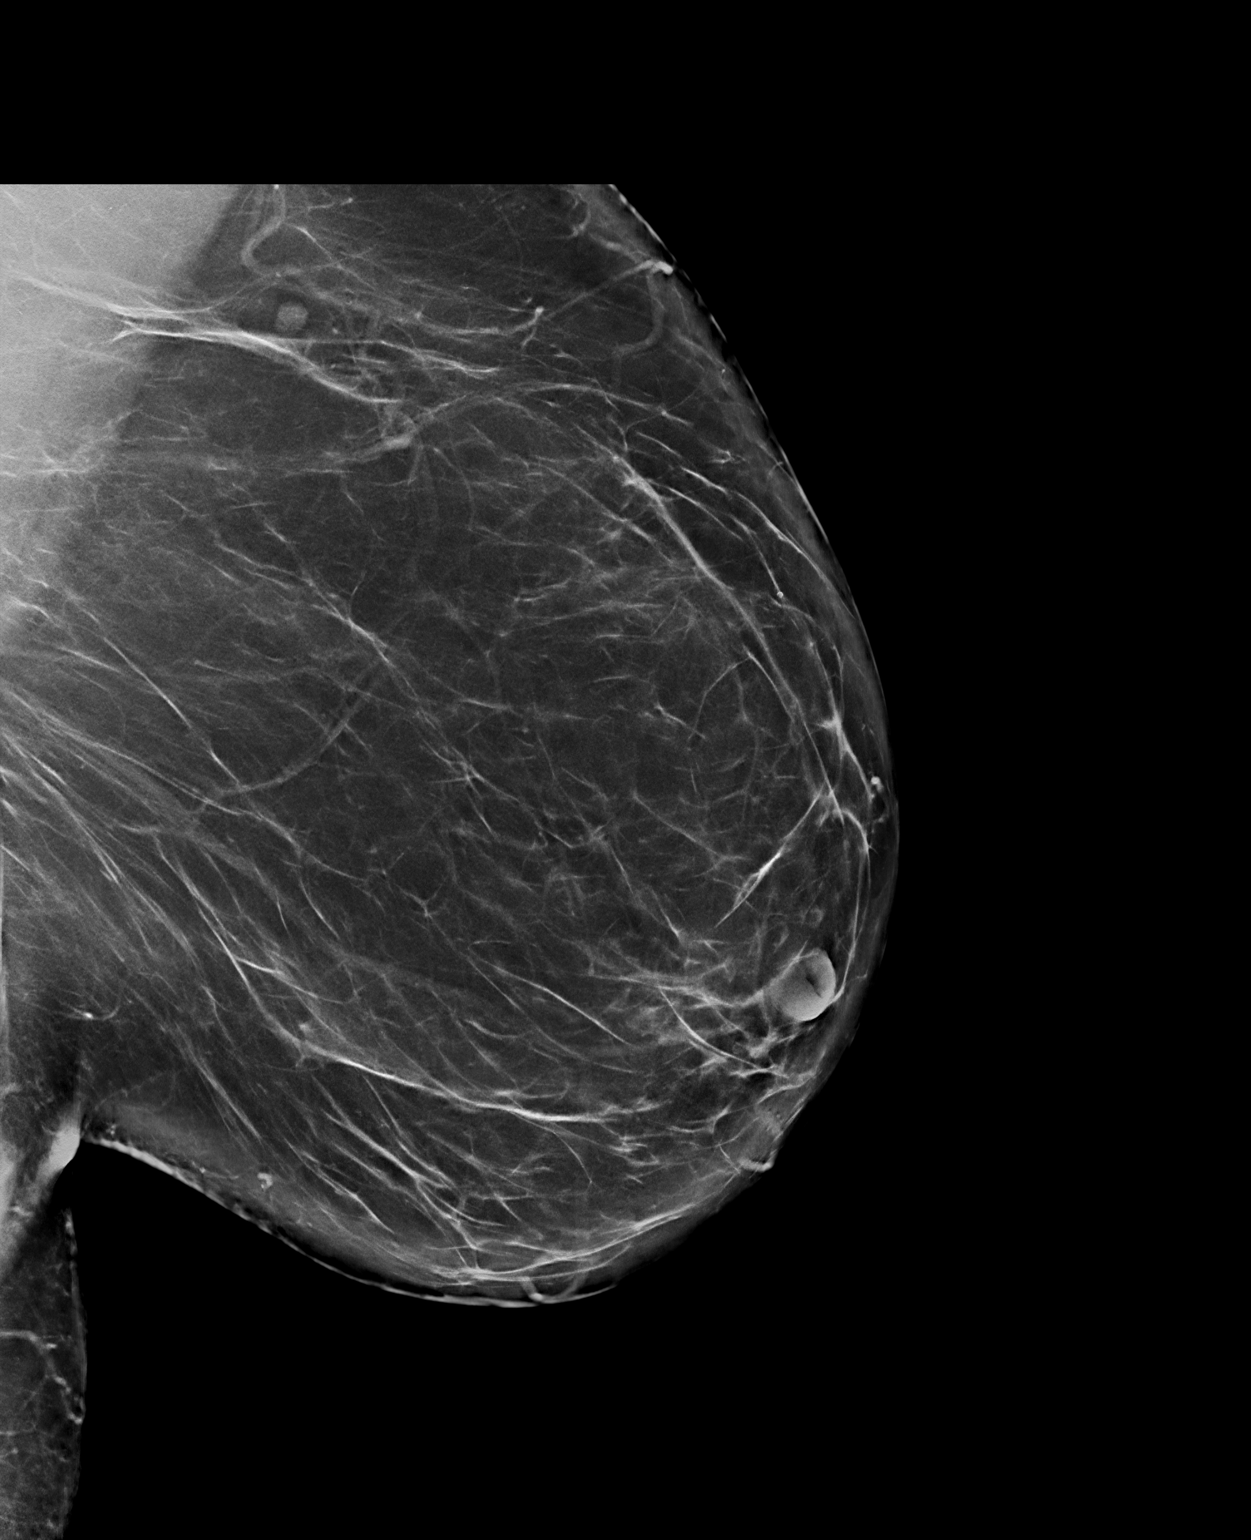

[R CC tomo · 2 of 84 frames shown]
[frame 28/84]
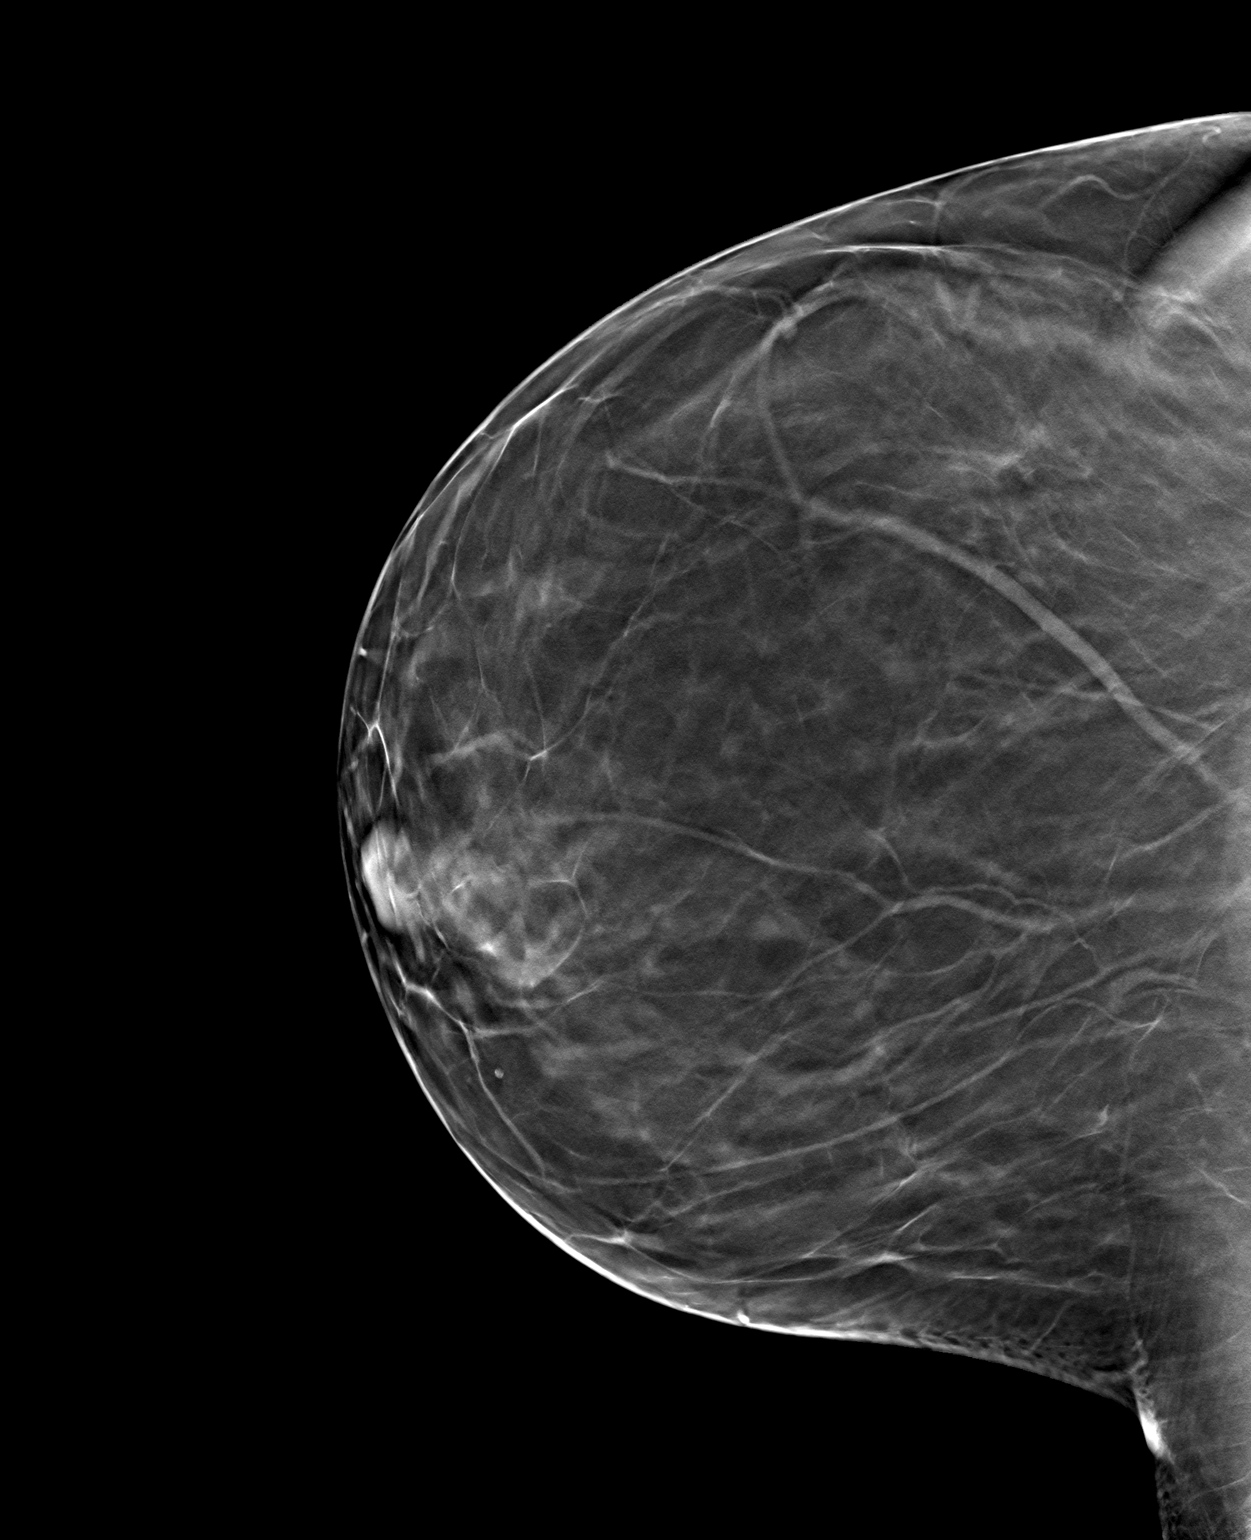
[frame 43/84]
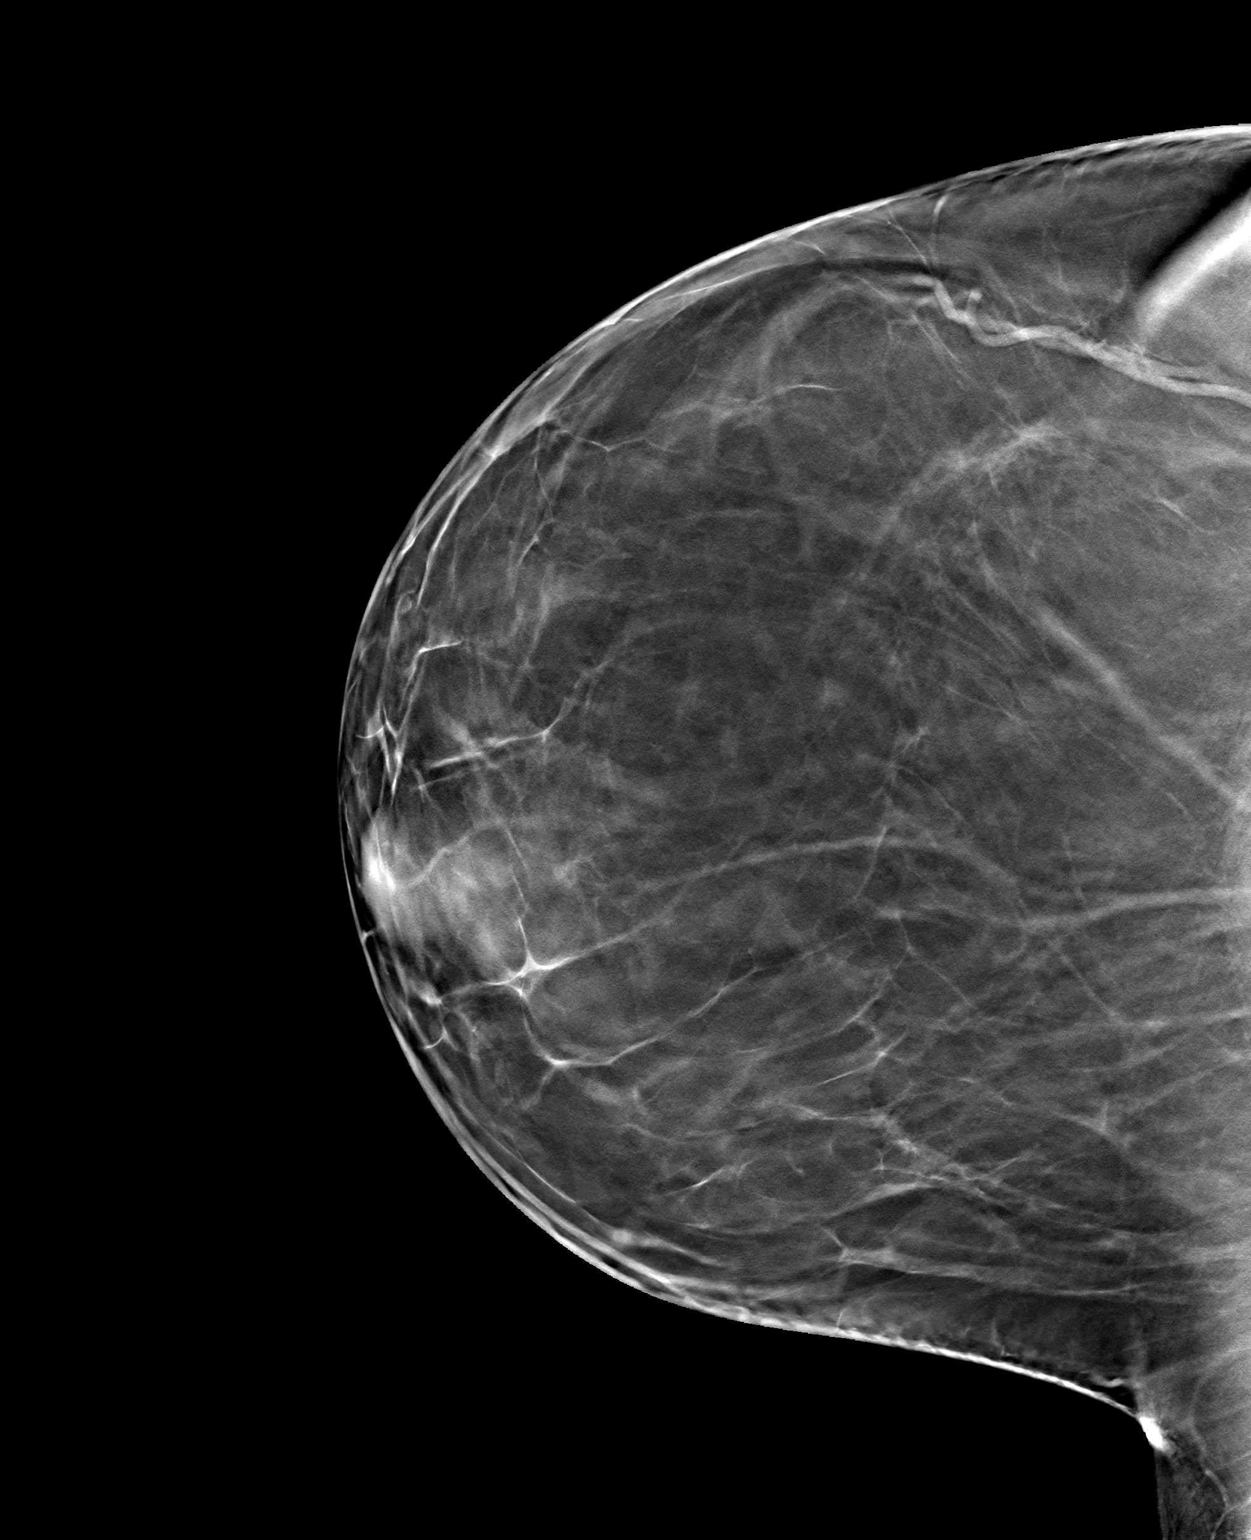

[3 of 6 positions shown; findings below may reference images not displayed]

ACR Breast Density Category b: There are scattered areas of
fibroglandular density.
FINDINGS: There are no findings suspicious for malignancy. Images were
processed with CAD.
IMPRESSION: No mammographic evidence of malignancy. A result letter of this
screening mammogram will be mailed directly to the patient.

RECOMMENDATION:
Screening mammogram in one year. (Code:Y5-G-EJ6)

BI-RADS CATEGORY  1: Negative.

## 2020-07-26 ENCOUNTER — Encounter: Payer: Self-pay | Admitting: Internal Medicine
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
## 2020-12-02 ENCOUNTER — Ambulatory Visit: Payer: 59 | Admitting: Gastroenterology

## 2020-12-02 ENCOUNTER — Encounter: Payer: Self-pay | Admitting: Gastroenterology

## 2021-02-23 ENCOUNTER — Encounter: Payer: Self-pay | Admitting: Internal Medicine

## 2021-04-14 ENCOUNTER — Ambulatory Visit: Payer: Self-pay

## 2021-04-14 ENCOUNTER — Other Ambulatory Visit: Payer: Self-pay

## 2021-04-25 ENCOUNTER — Ambulatory Visit: Payer: 59 | Admitting: Neurology

## 2021-04-25 ENCOUNTER — Encounter: Payer: Self-pay | Admitting: Neurology

## 2021-05-19 ENCOUNTER — Telehealth: Payer: Self-pay | Admitting: *Deleted

## 2021-05-19 NOTE — Telephone Encounter (Signed)
Multiple attempts made for past few days to reach pt.  Need to reschedule her nurse visit by phone.  Multiple voice messages left for pt. ?

## 2021-05-20 NOTE — Telephone Encounter (Signed)
Tried to call pt again today to verify that she has gotten all of my former messages.  Had to leave another detailed message for pt that we need to reschedule her appointment on Monday. ?

## 2021-05-23 ENCOUNTER — Ambulatory Visit: Payer: Self-pay

## 2021-06-20 ENCOUNTER — Ambulatory Visit: Payer: 59 | Admitting: Gastroenterology

## 2021-10-11 ENCOUNTER — Other Ambulatory Visit (HOSPITAL_COMMUNITY): Payer: Self-pay | Admitting: Adult Health Nurse Practitioner

## 2021-10-11 DIAGNOSIS — Z1231 Encounter for screening mammogram for malignant neoplasm of breast: Secondary | ICD-10-CM

## 2021-10-21 ENCOUNTER — Encounter (HOSPITAL_BASED_OUTPATIENT_CLINIC_OR_DEPARTMENT_OTHER): Payer: Self-pay

## 2021-10-21 DIAGNOSIS — R5383 Other fatigue: Secondary | ICD-10-CM

## 2021-10-21 DIAGNOSIS — R0683 Snoring: Secondary | ICD-10-CM

## 2021-10-21 DIAGNOSIS — G4710 Hypersomnia, unspecified: Secondary | ICD-10-CM

## 2021-12-02 ENCOUNTER — Encounter: Payer: 59 | Admitting: Pulmonary Disease

## 2021-12-07 ENCOUNTER — Ambulatory Visit: Payer: Managed Care, Other (non HMO) | Attending: Adult Health Nurse Practitioner | Admitting: Pulmonary Disease

## 2021-12-07 DIAGNOSIS — R5383 Other fatigue: Secondary | ICD-10-CM | POA: Insufficient documentation

## 2021-12-07 DIAGNOSIS — R0683 Snoring: Secondary | ICD-10-CM | POA: Insufficient documentation

## 2021-12-07 DIAGNOSIS — G4710 Hypersomnia, unspecified: Secondary | ICD-10-CM | POA: Diagnosis not present

## 2021-12-07 DIAGNOSIS — G4733 Obstructive sleep apnea (adult) (pediatric): Secondary | ICD-10-CM | POA: Diagnosis not present

## 2022-01-03 NOTE — Procedures (Signed)
Patient Name: Deborah Baker, Deborah Baker Date: 12/07/2021 Gender: Female D.O.B: [birth year] Age (years): 40 Referring Provider: Roe Rutherford NP Height (inches): 63 Interpreting Physician: Cyril Mourning MD, ABSM Weight (lbs): 192 RPSGT: Alfonso Ellis BMI: 34 MRN: [id number] Neck Size: <br> <br> <br> CLINICAL INFORMATION Sleep Study Type: HST    Indication for sleep study: N/A    Epworth Sleepiness Score: N/A  SLEEP STUDY TECHNIQUE A multi-channel overnight portable sleep study was performed. The channels recorded were: nasal airflow, thoracic respiratory movement, and oxygen saturation with a pulse oximetry. Snoring was also monitored.  MEDICATIONS Patient self administered medications include: N/A.  SLEEP ARCHITECTURE Patient was studied for 590.9 minutes. The sleep efficiency was 97.0 % and the patient was supine for 0%. The arousal index was 0.0 per hour.  RESPIRATORY PARAMETERS The overall AHI was 8.9 per hour, with a central apnea index of 0 per hour.  The oxygen nadir was 83% during sleep.    CARDIAC DATA Mean heart rate during sleep was 54.7 bpm.  IMPRESSIONS - Mild obstructive sleep apnea occurred during this study (AHI = 8.9/h). - Moderate oxygen desaturation was noted during this study (Min O2 = 83%). - Patient snored 21.1% during the sleep.   DIAGNOSIS - Obstructive Sleep Apnea (G47.33)   RECOMMENDATIONS - The cardiovascular implications of mild OSA are minimal.  Options include oral appliance and CPAP therapy or simply weight loss and no other  therapy - Oral appliance may be considered. - Avoid alcohol, sedatives and other CNS depressants that may worsen sleep apnea and disrupt normal sleep architecture. - Sleep hygiene should be reviewed to assess factors that may improve sleep quality. - Weight management and regular exercise should be initiated or continued. - Office visit with sleep physicians to discuss options   Cyril Mourning MD Board  Certified in Sleep medicine

## 2022-02-18 DIAGNOSIS — R0683 Snoring: Secondary | ICD-10-CM | POA: Diagnosis not present

## 2022-06-18 ENCOUNTER — Encounter (HOSPITAL_COMMUNITY): Payer: Self-pay

## 2022-06-18 ENCOUNTER — Emergency Department (HOSPITAL_COMMUNITY)
Admission: EM | Admit: 2022-06-18 | Discharge: 2022-06-18 | Disposition: A | Discharge status: Home / Self Care | Payer: Managed Care, Other (non HMO) | Attending: Emergency Medicine | Admitting: Emergency Medicine

## 2022-06-18 ENCOUNTER — Other Ambulatory Visit: Payer: Self-pay

## 2022-06-18 ENCOUNTER — Emergency Department (HOSPITAL_COMMUNITY): Payer: Managed Care, Other (non HMO)

## 2022-06-18 DIAGNOSIS — S34139A Unspecified injury to sacral spinal cord, initial encounter: Secondary | ICD-10-CM | POA: Diagnosis present

## 2022-06-18 DIAGNOSIS — W19XXXA Unspecified fall, initial encounter: Secondary | ICD-10-CM | POA: Diagnosis not present

## 2022-06-18 DIAGNOSIS — M25512 Pain in left shoulder: Secondary | ICD-10-CM | POA: Insufficient documentation

## 2022-06-18 DIAGNOSIS — S300XXA Contusion of lower back and pelvis, initial encounter: Secondary | ICD-10-CM | POA: Diagnosis not present

## 2022-06-18 MED ORDER — OXYCODONE HCL 5 MG PO TABS: 5.0000 mg | ORAL_TABLET | Freq: Four times a day (QID) | ORAL | 0 refills | Status: AC | PRN

## 2022-06-18 NOTE — Discharge Instructions (Addendum)
Workup today is overall reassuring.  No fractures.  Have sent in a short course of pain medication for you to use for severe breakthrough pain.  Primarily use Tylenol and ibuprofen for your symptoms.  You can take up to 1000 mg of Tylenol every 8 hours.  You can use 600 mg of ibuprofen every 6-8 hours.  Do not combine this with Excedrin or other anti-inflammatories.  I have given you orthopedic referral.  Follow-up with your primary care provider.  For any concerning symptoms return to the emergency department.

## 2022-06-18 NOTE — ED Provider Notes (Signed)
[name] Provider Note   CSN: [id number] Arrival date & time: 06/18/22  1456     History  Chief Complaint  Patient presents with   Arm Pain    Deborah Baker is a 57 y.o. female.  57 year old female presents today following a fall that occurred off of a tractor trailer 2 days ago.  At that time she was evaluated at the emergency room and [place], [place].  She reports bruising to her forearm, as well as upper arm.  Is not able to range her left shoulder at all.  She had an x-ray of her forearm, and humerus obtained.  She states she was prescribed Robaxin and naproxen.  Has not had any improvement since then.  She also complains of pain to her sacrum.  This was not evaluated in the ED at that time.  Denies any head injury or loss of consciousness.  No other complaints at this time.  The history is provided by the patient. No language interpreter was used.       Home Medications Prior to Admission medications   Medication Sig Start Date End Date Taking? Authorizing Provider  alum hydroxide-mag trisilicate (GAVISCON) 80-20 MG CHEW chewable tablet Chew 1 tablet by mouth 3 (three) times daily as needed for indigestion or heartburn.    [provider]  Aspirin-Acetaminophen-Caffeine (GOODY HEADACHE PO) Take 1 packet by mouth daily as needed (for pain).    [provider]  Azilsartan-Chlorthalidone (EDARBYCLOR) 40-25 MG TABS Take 1 tablet by mouth daily.    [provider]  B Complex Vitamins (B COMPLEX-B12 PO) Take 1 tablet by mouth daily.    [provider]  docusate sodium (COLACE) 100 MG capsule Take 100 mg by mouth daily as needed for mild constipation.    [provider]  escitalopram (LEXAPRO) 20 MG tablet Take 20 mg by mouth daily.     [provider]  ibuprofen (ADVIL,MOTRIN) 600 MG tablet Take 1 tablet (600 mg total) by mouth every 6 (six) hours as needed. 12/30/17   Ivery Quale, PA-C   LORazepam (ATIVAN) 0.5 MG tablet Take 0.5 mg by mouth every 8 (eight) hours.    [provider]  lovastatin (MEVACOR) 20 MG tablet Take 20 mg by mouth 2 (two) times daily.    [provider]  methocarbamol (ROBAXIN) 500 MG tablet Take 500 mg by mouth 4 (four) times daily.    [provider]  montelukast (SINGULAIR) 10 MG tablet Take 10 mg by mouth daily.     [provider]  omeprazole (PRILOSEC) 20 MG capsule Take 20 mg by mouth daily.    [provider]  oxyCODONE-acetaminophen (PERCOCET/ROXICET) 5-325 MG per tablet Take by mouth every 4 (four) hours as needed for severe pain.    [provider]  traMADol [name]) 50 MG tablet 1 or 2 po q6h prn pain 12/30/17   Ivery Quale, PA-C  albuterol (PROVENTIL HFA;VENTOLIN HFA) 108 (90 BASE) MCG/ACT inhaler Inhale 1-2 puffs into the lungs every 6 (six) hours as needed for wheezing or shortness of breath. Patient not taking: Reported on 10/02/2014 02/08/14 04/13/20  Shirleen Schirmer, PA-C      Allergies    Sulfa antibiotics    Review of Systems   Review of Systems  Eyes:  Negative for visual disturbance.  Gastrointestinal:  Negative for abdominal pain.  Musculoskeletal:  Positive for arthralgias. Negative for joint swelling and myalgias.  Neurological:  Negative for light-headedness  and headaches.  All other systems reviewed and are negative.   Physical Exam Updated Vital Signs BP (!) 154/86   Pulse 84   Temp 98.8 F (37.1 C) (Oral)   Resp 18   Ht 5\' 3"  (1.6 m)   Wt 86.2 kg   LMP 03/23/2014   SpO2 99%   BMI 33.66 kg/m  Physical Exam Vitals and nursing note reviewed.  Constitutional:      General: She is not in acute distress.    Appearance: Normal appearance. She is not ill-appearing.  HENT:     Head: Normocephalic and atraumatic.     Nose: Nose normal.  Eyes:     General: No scleral icterus.    Extraocular Movements: Extraocular movements intact.     Conjunctiva/sclera:  Conjunctivae normal.  Cardiovascular:     Rate and Rhythm: Normal rate and regular rhythm.     Heart sounds: Normal heart sounds.  Pulmonary:     Effort: Pulmonary effort is normal. No respiratory distress.  Abdominal:     General: There is no distension.     Palpations: Abdomen is soft.     Tenderness: There is no abdominal tenderness. There is no guarding.  Musculoskeletal:     Cervical back: Normal range of motion.     Comments: Limited range of motion in left upper extremity due to pain.  Significant tenderness to palpation present over the left shoulder, as well as clavicle.  Bruising noted to proximal humerus as well as left distal forearm.  2+ radial pulse present on the left.  Full range of motion all digits of the left hand.  Good grip strength on the left.  Sensation intact.  Cervical, thoracic, lumbar spine without tenderness to palpation.  Good range of motion of the cervical spine.  Elbow without tenderness to palpation.  Skin:    General: Skin is warm and dry.  Neurological:     General: No focal deficit present.     Mental Status: She is alert. Mental status is at baseline.     ED Results / Procedures / Treatments   Labs (all labs ordered are listed, but only abnormal results are displayed) Labs Reviewed - No data to display  EKG None  Radiology No results found.  Procedures Procedures    Medications Ordered in ED Medications - No data to display  ED Course/ Medical Decision Making/ A&P                             Medical Decision Making Amount and/or Complexity of Data Reviewed Radiology: ordered.  Risk Prescription drug management.   57 year old female presents following a fall that occurred 2 days ago off of her tractor trailer.  Tractor-trailer once stopped.  She has bruising to her left forearm, left proximal humerus.  Significant shoulder pain.  Significant limitations in range of motion.  She had x-ray of the humerus, and left wrist.   Significant pain over the left shoulder and clavicle.  Also complains of sacral pain.  Will add on sacral x-ray.  X-rays without acute findings.  Will provide sling.  Short course of pain medication given.  Symptomatic management discussed.  Ortho referral given.  Patient voices understanding and is in agreement with plan.  Final Clinical Impression(s) / ED Diagnoses Final diagnoses:  Acute pain of left shoulder  Contusion of sacrum, initial encounter    Rx / DC Orders ED Discharge Orders  None         Marita Kansas, PA-C 06/18/22 1724    Eber Hong, MD 06/18/22 2328

## 2022-06-18 NOTE — ED Triage Notes (Signed)
Pt reports she fell off the side of a truck at work Monday night and was seen at the ER in [place] and was told she was just sprained and bruised and given robaxin but pt wants a second opinion as she was not given any pain medication and does not feel like she is improving.

## 2022-12-26 ENCOUNTER — Encounter: Payer: Self-pay | Admitting: *Deleted

## 2023-02-13 ENCOUNTER — Encounter: Payer: Self-pay | Admitting: Internal Medicine

## 2023-02-19 ENCOUNTER — Encounter: Payer: Self-pay | Admitting: Internal Medicine

## 2023-06-27 ENCOUNTER — Encounter (INDEPENDENT_AMBULATORY_CARE_PROVIDER_SITE_OTHER): Payer: Self-pay | Admitting: *Deleted

## 2023-08-17 ENCOUNTER — Encounter: Payer: Self-pay | Admitting: Advanced Practice Midwife
